# Patient Record
Sex: Female | Born: 1980 | Hispanic: Yes | Marital: Married | Smoking: Former smoker
Health system: Southern US, Community
[De-identification: ages and names within clinical notes are randomized; demographics above are authoritative.]

## PROBLEM LIST (undated history)

## (undated) DIAGNOSIS — C801 Malignant (primary) neoplasm, unspecified: Secondary | ICD-10-CM

## (undated) DIAGNOSIS — E119 Type 2 diabetes mellitus without complications: Secondary | ICD-10-CM

## (undated) DIAGNOSIS — C541 Malignant neoplasm of endometrium: Secondary | ICD-10-CM

## (undated) DIAGNOSIS — K5792 Diverticulitis of intestine, part unspecified, without perforation or abscess without bleeding: Secondary | ICD-10-CM

## (undated) HISTORY — DX: Diverticulitis of intestine, part unspecified, without perforation or abscess without bleeding: K57.92

## (undated) HISTORY — PX: ABDOMINAL HYSTERECTOMY: SHX81

---

## 2017-01-17 ENCOUNTER — Emergency Department: Admission: EM | Admit: 2017-01-17 | Discharge: 2017-01-17 | Discharge status: Absent without leave | Payer: Self-pay

## 2017-01-17 NOTE — ED Notes (Signed)
No answer when called several times from lobby 

## 2018-04-15 ENCOUNTER — Emergency Department
Admission: EM | Admit: 2018-04-15 | Discharge: 2018-04-15 | Disposition: A | Discharge status: Home / Self Care | Payer: Self-pay | Attending: Emergency Medicine | Admitting: Emergency Medicine

## 2018-04-15 ENCOUNTER — Other Ambulatory Visit: Payer: Self-pay

## 2018-04-15 ENCOUNTER — Encounter: Payer: Self-pay | Admitting: Emergency Medicine

## 2018-04-15 ENCOUNTER — Emergency Department: Payer: Self-pay

## 2018-04-15 DIAGNOSIS — Z794 Long term (current) use of insulin: Secondary | ICD-10-CM | POA: Insufficient documentation

## 2018-04-15 DIAGNOSIS — F17200 Nicotine dependence, unspecified, uncomplicated: Secondary | ICD-10-CM | POA: Insufficient documentation

## 2018-04-15 DIAGNOSIS — R1032 Left lower quadrant pain: Secondary | ICD-10-CM | POA: Insufficient documentation

## 2018-04-15 DIAGNOSIS — R197 Diarrhea, unspecified: Secondary | ICD-10-CM | POA: Insufficient documentation

## 2018-04-15 DIAGNOSIS — R112 Nausea with vomiting, unspecified: Secondary | ICD-10-CM | POA: Insufficient documentation

## 2018-04-15 DIAGNOSIS — E119 Type 2 diabetes mellitus without complications: Secondary | ICD-10-CM | POA: Insufficient documentation

## 2018-04-15 HISTORY — DX: Type 2 diabetes mellitus without complications: E11.9

## 2018-04-15 HISTORY — DX: Malignant (primary) neoplasm, unspecified: C80.1

## 2018-04-15 LAB — URINALYSIS, COMPLETE (UACMP) WITH MICROSCOPIC
Bilirubin Urine: NEGATIVE
Glucose, UA: >=500 mg/dL — AB
Hgb urine dipstick: NEGATIVE
Ketones, ur: 5 mg/dL — AB
Nitrite: NEGATIVE
PH: 5 (ref 5.0–8.0)
Protein, ur: NEGATIVE mg/dL
SPECIFIC GRAVITY, URINE: 1.024 (ref 1.005–1.030)

## 2018-04-15 LAB — COMPREHENSIVE METABOLIC PANEL
ALBUMIN: 3.5 g/dL (ref 3.5–5.0)
ALK PHOS: 114 U/L (ref 38–126)
ALT: 54 U/L (ref 14–54)
AST: 49 U/L — AB (ref 15–41)
Anion gap: 10 (ref 5–15)
BILIRUBIN TOTAL: 0.8 mg/dL (ref 0.3–1.2)
BUN: 8 mg/dL (ref 6–20)
CO2: 21 mmol/L — ABNORMAL LOW (ref 22–32)
CREATININE: 0.52 mg/dL (ref 0.44–1.00)
Calcium: 8.4 mg/dL — ABNORMAL LOW (ref 8.9–10.3)
Chloride: 103 mmol/L (ref 101–111)
GFR calc Af Amer: >60 mL/min (ref >60)
GFR calc non Af Amer: >60 mL/min (ref >60)
GLUCOSE: 359 mg/dL — AB (ref 65–99)
POTASSIUM: 3.8 mmol/L (ref 3.5–5.1)
Sodium: 134 mmol/L — ABNORMAL LOW (ref 135–145)
Total Protein: 6.9 g/dL (ref 6.5–8.1)

## 2018-04-15 LAB — PREGNANCY, URINE: Preg Test, Ur: NEGATIVE

## 2018-04-15 LAB — BLOOD GAS, VENOUS
ACID-BASE EXCESS: 1.2 mmol/L (ref 0.0–2.0)
BICARBONATE: 25.1 mmol/L (ref 20.0–28.0)
O2 SAT: 87.2 %
PH VEN: 7.44 — AB (ref 7.250–7.430)
PO2 VEN: 51 mmHg — AB (ref 32.0–45.0)
Patient temperature: 37
pCO2, Ven: 37 mmHg — ABNORMAL LOW (ref 44.0–60.0)

## 2018-04-15 LAB — POCT PREGNANCY, URINE: Preg Test, Ur: NEGATIVE

## 2018-04-15 LAB — CBC
HEMATOCRIT: 40.1 % (ref 35.0–47.0)
HEMOGLOBIN: 14.3 g/dL (ref 12.0–16.0)
MCH: 31.9 pg (ref 26.0–34.0)
MCHC: 35.7 g/dL (ref 32.0–36.0)
MCV: 89.4 fL (ref 80.0–100.0)
Platelets: 234 10*3/uL (ref 150–440)
RBC: 4.48 MIL/uL (ref 3.80–5.20)
RDW: 12.8 % (ref 11.5–14.5)
WBC: 13 10*3/uL — AB (ref 3.6–11.0)

## 2018-04-15 LAB — LIPASE, BLOOD: Lipase: 28 U/L (ref 11–51)

## 2018-04-15 LAB — ETHANOL: Alcohol, Ethyl (B): <10 mg/dL (ref <10)

## 2018-04-15 LAB — BETA-HYDROXYBUTYRIC ACID: Beta-Hydroxybutyric Acid: 0.18 mmol/L (ref 0.05–0.27)

## 2018-04-15 MED ORDER — SODIUM CHLORIDE 0.9 % IV BOLUS
1000.0000 mL | Freq: Once | INTRAVENOUS | Status: AC
  Administered 2018-04-15: 1000 mL via INTRAVENOUS

## 2018-04-15 MED ORDER — IOPAMIDOL (ISOVUE-300) INJECTION 61%
100.0000 mL | Freq: Once | INTRAVENOUS | Status: AC | PRN
  Administered 2018-04-15: 100 mL via INTRAVENOUS

## 2018-04-15 MED ORDER — FENTANYL CITRATE (PF) 100 MCG/2ML IJ SOLN
50.0000 ug | Freq: Once | INTRAMUSCULAR | Status: AC
  Administered 2018-04-15: 50 ug via INTRAVENOUS
  Filled 2018-04-15: qty 2

## 2018-04-15 MED ORDER — ONDANSETRON HCL 4 MG PO TABS: 4.0000 mg | ORAL_TABLET | Freq: Three times a day (TID) | ORAL | 0 refills | Status: DC | PRN

## 2018-04-15 MED ORDER — ONDANSETRON HCL 4 MG/2ML IJ SOLN
4.0000 mg | Freq: Once | INTRAMUSCULAR | Status: AC
  Administered 2018-04-15: 4 mg via INTRAVENOUS
  Filled 2018-04-15: qty 2

## 2018-04-15 NOTE — ED Triage Notes (Signed)
Pt to ED via POV c/o abdominal pain, N/V/D and high blood sugar. Pt states that her blood sugar was 338 at home. Pt states that other symptoms started this morning around 0400. Pt is in NAD at this time.

## 2018-04-15 NOTE — ED Notes (Signed)
Pt states central abd pain with L kidney pain that began this AM. Also c/o N&V&D. States type 2 DM but uses insulin. Denies blood in vomit or stool. Does NOT have appendix but still has gallbladder. States ate pizza last night and states husband is feeling similar but pt states she has been feeling this way x few days.

## 2018-04-15 NOTE — ED Provider Notes (Addendum)
[facility] Emergency Department Provider Note  ____________________________________________   I have reviewed the triage vital signs and the nursing notes. Where available I have reviewed prior notes and, if possible and indicated, outside hospital notes.    HISTORY  Chief Complaint Abdominal Pain; Emesis; and Hyperglycemia    HPI Erica Bush is a 37 y.o. female with a history of poorly controlled diabetes mellitus, on insulin, baseline blood sugars are in the 300s, history of chronic lower abdominal pain history of appendectomy in 1998, with peripheral resulting in a temporary ileostomy to what sounds like, since that time, 20 years ago, she has had chronic lower abdominal discomfort in the left side, she has history of diverticulitis.  She states is been about a year since her last flare.  She was living in [place] for the last couple years.  She states that she began having left-sided abdominal pain which is consistent with her chronic diverticular pathology, as well as vomiting x10 and diarrhea nonbloody non-melanotic.  She states that she has had no fever or chills.  She is requesting pain medication.  Patient while she is in [place] went to [place] [facility] but she cannot recall the name of that hospital to which she went.  She states this is consistent with her prior chronic recurrent abdominal pain sometimes associate with diverticulitis sometimes just pain  Pain is a crampy discomfort nothing makes better nothing makes it worse, been there since last night gradual onset, no radiation, no fever no chills no antecedent intervention.  Requesting narcotic pain medication, morphine   Past Medical History:  Diagnosis Date  . Diabetes mellitus without complication ([place])     There are no active problems to display for this patient.     Prior to Admission medications   Not on File    Allergies Ciprofloxacin  No family history on  file.  Social History Social History   Tobacco Use  . Smoking status: Current Every Day Smoker  . Smokeless tobacco: Never Used  Substance Use Topics  . Alcohol use: Not Currently  . Drug use: Not Currently    Review of Systems Constitutional: No fever/chills Eyes: No visual changes. ENT: No sore throat. No stiff neck no neck pain Cardiovascular: Denies chest pain. Respiratory: Denies shortness of breath. Gastrointestinal:   See HPI Genitourinary: Negative for dysuria. Musculoskeletal: Negative lower extremity swelling Skin: Negative for rash. Neurological: Negative for severe headaches, focal weakness or numbness.   ____________________________________________   PHYSICAL EXAM:  VITAL SIGNS: ED Triage Vitals  Enc Vitals Group     BP 04/15/18 0905 120/71     Pulse Rate 04/15/18 0905 (!) 117     Resp 04/15/18 0905 16     Temp 04/15/18 0905 98.1 F (36.7 C)     Temp Source 04/15/18 0905 Oral     SpO2 04/15/18 0905 96 %     Weight 04/15/18 0904 180 lb (81.6 kg)     Height 04/15/18 0904 5\' 1"  (1.549 m)     Head Circumference --      Peak Flow --      Pain Score 04/15/18 0904 9     Pain Loc --      Pain Edu? --      Excl. in [place]? --     Constitutional: Alert and oriented. Well appearing and in no acute distress. Eyes: Conjunctivae are normal Head: Atraumatic HEENT: No congestion/rhinnorhea. Mucous membranes are moist.  Oropharynx non-erythematous Neck:   Nontender  with no meningismus, no masses, no stridor Cardiovascular: Normal rate, regular rhythm. Grossly normal heart sounds.  Good peripheral circulation. Respiratory: Normal respiratory effort.  No retractions. Lungs CTAB. Abdominal: Soft and tender to palpation left lower quadrant, voluntary guarding no rebound, distractible exam recently noted.. No distention.  Back:  There is no focal tenderness or step off.  there is no midline tenderness there are no lesions noted. there is no CVA  tenderness Musculoskeletal: No lower extremity tenderness, no upper extremity tenderness. No joint effusions, no DVT signs strong distal pulses no edema Neurologic:  Normal speech and language. No gross focal neurologic deficits are appreciated.  Skin:  Skin is warm, dry and intact. No rash noted. Psychiatric: Mood and affect are anxious. Speech and behavior are normal.  ____________________________________________   LABS (all labs ordered are listed, but only abnormal results are displayed)  Labs Reviewed  BLOOD GAS, VENOUS - Abnormal; Notable for the following components:      Result Value   pH, Ven 7.44 (*)    pCO2, Ven 37 (*)    pO2, Ven 51.0 (*)    All other components within normal limits  CBC - Abnormal; Notable for the following components:   WBC 13.0 (*)    All other components within normal limits  COMPREHENSIVE METABOLIC PANEL - Abnormal; Notable for the following components:   Sodium 134 (*)    CO2 21 (*)    Glucose, Bld 359 (*)    Calcium 8.4 (*)    AST 49 (*)    All other components within normal limits  ETHANOL  LIPASE, BLOOD  URINALYSIS, COMPLETE (UACMP) WITH MICROSCOPIC  PREGNANCY, URINE  BETA-HYDROXYBUTYRIC ACID  POCT PREGNANCY, URINE  POC URINE PREG, ED    Pertinent labs  results that were available during my care of the patient were reviewed by me and considered in my medical decision making (see chart for details). ____________________________________________  EKG  I personally interpreted any EKGs ordered by me or triage  ____________________________________________  RADIOLOGY  Pertinent labs & imaging results that were available during my care of the patient were reviewed by me and considered in my medical decision making (see chart for details). If possible, patient and/or family made aware of any abnormal findings.  No results found. ____________________________________________    PROCEDURES  Procedure(s) performed:  None  Procedures  Critical Care performed: None  ____________________________________________   INITIAL IMPRESSION / ASSESSMENT AND PLAN / ED COURSE  Pertinent labs & imaging results that were available during my care of the patient were reviewed by me and considered in my medical decision making (see chart for details).  Patient with chronic recurrent abdominal pain has had extensive CT scans sometimes has diverticulitis sometimes is not most of the time there is not, I can see 11 to her prior CT scans in this patient however, given history prior abdominal surgeries patient's complaint of significant discomfort, focalizing pain, history of diverticulitis, even though I have low suspicion for perforation or abscess I think in this case unfortunately we will have to obtain imaging as multiple ER doctors unfortunately have been to do in the past.  Meantime we will give her IV fluids treat her pain.  Nothing to suggest DKA, patient was mildly tachycardic but she is also quite anxious, and has been volume depleted.  We will give her IV fluid.  VBG shows no evidence of acidemia fortunately and again her baseline blood sugars are in the 300s.  We will treat her pain  and give her nausea medication give her fluid and find out if there is something more significant in her chronic recurrent abdominal pain today.  ----------------------------------------- 12:14 PM on 04/15/2018 -----------------------------------------  Serial abdominal exams are benign heart rate is in the 90s when I am in the room, patient very angry that I am not giving her more narcotic pain medication for this chronic abdominal pain.  I tried to explain to her, that with a negative CT scan reassuring blood work reassuring vitals etc. there is no indication for acute narcotic pain medication.  This is the patient's 12th CT scan for what seems to be the exact same complaint over the last several years that I can find and likely there are  others in [place] I cannot.  I did expand her I am not comfortable giving her narcotic pain medications and she became very upset.  I did go through point by point all of her findings which are certainly reassuring and her belly is not surgical.  Patient is tolerating p.o. here.  After our discussion of narcotic pain medication her heart rate did go up, I do not think this is a pathologic this is more of a reactive heart rate.  In any event I will send her home with anti-medics, and we will have her follow closely as an outpatient.  Considering the patient's symptoms, medical history, and physical examination today, I have low suspicion for cholecystitis or biliary pathology, pancreatitis, perforation or bowel obstruction, hernia, intra-abdominal abscess, AAA or dissection, volvulus or intussusception, mesenteric ischemia, ischemic gut, pyelonephritis or appendicitis.    ____________________________________________   FINAL CLINICAL IMPRESSION(S) / ED DIAGNOSES  Final diagnoses:  None      This chart was dictated using voice recognition software.  Despite best efforts to proofread,  errors can occur which can change meaning.      Schuyler Amor, MD 04/15/18 1028    Schuyler Amor, MD 04/15/18 1221    Schuyler Amor, MD 04/15/18 [phone]

## 2018-04-15 NOTE — ED Notes (Signed)
Recollect of blood tubes sent to lab at this time.

## 2018-10-24 ENCOUNTER — Emergency Department: Payer: Self-pay

## 2018-10-24 ENCOUNTER — Other Ambulatory Visit: Payer: Self-pay

## 2018-10-24 ENCOUNTER — Encounter: Payer: Self-pay | Admitting: Emergency Medicine

## 2018-10-24 ENCOUNTER — Emergency Department
Admission: EM | Admit: 2018-10-24 | Discharge: 2018-10-24 | Disposition: A | Discharge status: Home / Self Care | Payer: Self-pay | Attending: Emergency Medicine | Admitting: Emergency Medicine

## 2018-10-24 DIAGNOSIS — E119 Type 2 diabetes mellitus without complications: Secondary | ICD-10-CM | POA: Insufficient documentation

## 2018-10-24 DIAGNOSIS — K529 Noninfective gastroenteritis and colitis, unspecified: Secondary | ICD-10-CM | POA: Insufficient documentation

## 2018-10-24 DIAGNOSIS — F17210 Nicotine dependence, cigarettes, uncomplicated: Secondary | ICD-10-CM | POA: Insufficient documentation

## 2018-10-24 LAB — URINALYSIS, COMPLETE (UACMP) WITH MICROSCOPIC
Bilirubin Urine: NEGATIVE
Glucose, UA: 50 mg/dL — AB
Hgb urine dipstick: NEGATIVE
Ketones, ur: 5 mg/dL — AB
Leukocytes, UA: NEGATIVE
Nitrite: NEGATIVE
Protein, ur: NEGATIVE mg/dL
Specific Gravity, Urine: 1.025 (ref 1.005–1.030)
pH: 5 (ref 5.0–8.0)

## 2018-10-24 LAB — CBC
HCT: 42.7 % (ref 36.0–46.0)
Hemoglobin: 15.2 g/dL — ABNORMAL HIGH (ref 12.0–15.0)
MCH: 31.5 pg (ref 26.0–34.0)
MCHC: 35.6 g/dL (ref 30.0–36.0)
MCV: 88.6 fL (ref 80.0–100.0)
NRBC: 0 % (ref 0.0–0.2)
PLATELETS: 306 10*3/uL (ref 150–400)
RBC: 4.82 MIL/uL (ref 3.87–5.11)
RDW: 11.6 % (ref 11.5–15.5)
WBC: 13.4 10*3/uL — ABNORMAL HIGH (ref 4.0–10.5)

## 2018-10-24 LAB — COMPREHENSIVE METABOLIC PANEL
ALT: 42 U/L (ref 0–44)
AST: 31 U/L (ref 15–41)
Albumin: 3.8 g/dL (ref 3.5–5.0)
Alkaline Phosphatase: 127 U/L — ABNORMAL HIGH (ref 38–126)
Anion gap: 12 (ref 5–15)
BUN: 14 mg/dL (ref 6–20)
CHLORIDE: 100 mmol/L (ref 98–111)
CO2: 22 mmol/L (ref 22–32)
Calcium: 8.8 mg/dL — ABNORMAL LOW (ref 8.9–10.3)
Creatinine, Ser: 0.68 mg/dL (ref 0.44–1.00)
Glucose, Bld: 288 mg/dL — ABNORMAL HIGH (ref 70–99)
POTASSIUM: 3.6 mmol/L (ref 3.5–5.1)
SODIUM: 134 mmol/L — AB (ref 135–145)
Total Bilirubin: 1 mg/dL (ref 0.3–1.2)
Total Protein: 7.6 g/dL (ref 6.5–8.1)

## 2018-10-24 LAB — LIPASE, BLOOD: LIPASE: 32 U/L (ref 11–51)

## 2018-10-24 MED ORDER — MORPHINE SULFATE (PF) 4 MG/ML IV SOLN
4.0000 mg | Freq: Once | INTRAVENOUS | Status: AC
  Administered 2018-10-24: 4 mg via INTRAVENOUS
  Filled 2018-10-24: qty 1

## 2018-10-24 MED ORDER — ONDANSETRON 4 MG PO TBDP: 4.0000 mg | ORAL_TABLET | Freq: Three times a day (TID) | ORAL | 0 refills | Status: DC | PRN

## 2018-10-24 MED ORDER — ONDANSETRON HCL 4 MG/2ML IJ SOLN
4.0000 mg | Freq: Once | INTRAMUSCULAR | Status: AC
  Administered 2018-10-24: 4 mg via INTRAVENOUS
  Filled 2018-10-24: qty 2

## 2018-10-24 NOTE — ED Notes (Signed)
Pt reports that 2 days ago she started with left flank pain that progressed to bilat flank pain - denies any urinary symptoms - reports pain 10/10 - reports that this am she started with vomiting

## 2018-10-24 NOTE — ED Provider Notes (Signed)
[facility] Emergency Department Provider Note   ____________________________________________    I have reviewed the triage vital signs and the nursing notes.   HISTORY  Chief Complaint Emesis and Flank Pain     HPI Erica Bush is a 38 y.o. female who presents with complaints of nausea, vomiting and bilateral flank pain, left greater than right.  She reports she had similar symptoms several years ago related to a kidney stone.  She reports pain has been on and off over past 2 days but it got much worse last night.  Has not take anything for this.  Denies fevers or chills.  She is diabetic.  Nothing seems to make her pain better   Past Medical History:  Diagnosis Date  . Cancer ([place])   . Diabetes mellitus without complication ([place])     There are no active problems to display for this patient.   Past Surgical History:  Procedure Laterality Date  . ABDOMINAL HYSTERECTOMY      Prior to Admission medications   Medication Sig Start Date End Date Taking? Authorizing Provider  albuterol (ACCUNEB) 0.63 MG/3ML nebulizer solution Inhale 0.63 mg into the lungs every 6 (six) hours as needed.   Yes [provider]  insulin NPH-regular Human (70-30) 100 UNIT/ML injection Inject 30-40 Units into the skin daily with supper.   Yes [provider]  ondansetron (ZOFRAN ODT) 4 MG disintegrating tablet Take 1 tablet (4 mg total) by mouth every 8 (eight) hours as needed for nausea or vomiting. 10/24/18   Lavonia Drafts, MD     Allergies Ciprofloxacin and Dexamethasone sodium phosphate  History reviewed. No pertinent family history.  Social History Social History   Tobacco Use  . Smoking status: Current Every Day Smoker  . Smokeless tobacco: Never Used  Substance Use Topics  . Alcohol use: Not Currently  . Drug use: Not Currently    Review of Systems  Constitutional: No fever/chills Eyes: No visual changes.  ENT: No sore  throat. Cardiovascular: Denies chest pain. Respiratory: Denies shortness of breath. Gastrointestinal: As above Genitourinary: Negative for dysuria. Musculoskeletal: Negative for back pain. Skin: Negative for rash. Neurological: Negative for headaches    ____________________________________________   PHYSICAL EXAM:  VITAL SIGNS: ED Triage Vitals  Enc Vitals Group     BP 10/24/18 0728 122/85     Pulse Rate 10/24/18 0728 (!) 106     Resp 10/24/18 0728 (!) 24     Temp 10/24/18 0728 98.8 F (37.1 C)     Temp Source 10/24/18 0728 Oral     SpO2 10/24/18 0728 96 %     Weight --      Height --      Head Circumference --      Peak Flow --      Pain Score 10/24/18 0724 10     Pain Loc --      Pain Edu? --      Excl. in [place]? --     Constitutional: Alert and oriented.  Eyes: Conjunctivae are normal.   Nose: No congestion/rhinnorhea. Mouth/Throat: Mucous membranes are moist.    Cardiovascular: Normal rate, regular rhythm. Grossly normal heart sounds.  Good peripheral circulation. Respiratory: Normal respiratory effort.  No retractions. Lungs CTAB. Gastrointestinal: Soft and nontender. No distention.  Left CVA tenderness Musculoskeletal: Warm and well perfused Neurologic:  Normal speech and language. No gross focal neurologic deficits are appreciated.  Skin:  Skin is warm, dry and intact. No rash  noted. Psychiatric: Mood and affect are normal. Speech and behavior are normal.  ____________________________________________   LABS (all labs ordered are listed, but only abnormal results are displayed)  Labs Reviewed  CBC - Abnormal; Notable for the following components:      Result Value   WBC 13.4 (*)    Hemoglobin 15.2 (*)    All other components within normal limits  COMPREHENSIVE METABOLIC PANEL - Abnormal; Notable for the following components:   Sodium 134 (*)    Glucose, Bld 288 (*)    Calcium 8.8 (*)    Alkaline Phosphatase 127 (*)    All other components within  normal limits  URINALYSIS, COMPLETE (UACMP) WITH MICROSCOPIC - Abnormal; Notable for the following components:   Color, Urine YELLOW (*)    APPearance CLOUDY (*)    Glucose, UA 50 (*)    Ketones, ur 5 (*)    Bacteria, UA RARE (*)    All other components within normal limits  LIPASE, BLOOD   ____________________________________________  EKG  None ____________________________________________  RADIOLOGY  CT renal stone study ____________________________________________   PROCEDURES  Procedure(s) performed: No  Procedures   Critical Care performed: No ____________________________________________   INITIAL IMPRESSION / ASSESSMENT AND PLAN / ED COURSE  Pertinent labs & imaging results that were available during my care of the patient were reviewed by me and considered in my medical decision making (see chart for details).  Patient presents with bilateral flank pain, differential includes [place], less likely ureterolithiasis although she does report left greater than right pain.  We will give IV morphine, IV Zofran, obtain labs, urinalysis, CT renal stone study and reevaluate.   ----------------------------------------- 9:47 AM on 10/24/2018 -----------------------------------------  CT negative for kidney stone, no acute abnormalities.  Reevaluated patient, she is feeling significantly better, still having some abdominal cramping.  She does admit to diarrhea as well as her nausea vomiting, possible viral gastroenteritis which is common in the area at this time   Appropriate for discharge at this point with outpatient follow-up    ____________________________________________   FINAL CLINICAL IMPRESSION(S) / ED DIAGNOSES  Final diagnoses:  Gastroenteritis        Note:  This document was prepared using Dragon voice recognition software and may include unintentional dictation errors.    Lavonia Drafts, MD 10/24/18 1204

## 2018-10-24 NOTE — ED Triage Notes (Signed)
Here for pain across bilateral flank area.  Started vomiting this morning. Is diabetic. Has not checked sugar. Took insulin last night. Has not checked blood sugar.  Vomiting at check in desk.  Appears in pain.

## 2018-10-24 NOTE — ED Notes (Signed)
POC Urine Preg NEGATIVE - Dr Corky Downs notified

## 2018-11-21 ENCOUNTER — Other Ambulatory Visit: Payer: Self-pay

## 2018-11-21 ENCOUNTER — Emergency Department: Payer: Self-pay

## 2018-11-21 ENCOUNTER — Inpatient Hospital Stay
Admission: EM | Admit: 2018-11-21 | Discharge: 2018-11-24 | DRG: 392 | Disposition: A | Discharge status: Home / Self Care | Payer: Self-pay | Attending: Internal Medicine | Admitting: Internal Medicine

## 2018-11-21 ENCOUNTER — Encounter: Payer: Self-pay | Admitting: Radiology

## 2018-11-21 DIAGNOSIS — Z90710 Acquired absence of both cervix and uterus: Secondary | ICD-10-CM

## 2018-11-21 DIAGNOSIS — Z794 Long term (current) use of insulin: Secondary | ICD-10-CM

## 2018-11-21 DIAGNOSIS — Z881 Allergy status to other antibiotic agents status: Secondary | ICD-10-CM

## 2018-11-21 DIAGNOSIS — E119 Type 2 diabetes mellitus without complications: Secondary | ICD-10-CM | POA: Diagnosis present

## 2018-11-21 DIAGNOSIS — F17200 Nicotine dependence, unspecified, uncomplicated: Secondary | ICD-10-CM | POA: Diagnosis present

## 2018-11-21 DIAGNOSIS — Z8542 Personal history of malignant neoplasm of other parts of uterus: Secondary | ICD-10-CM

## 2018-11-21 DIAGNOSIS — Z9049 Acquired absence of other specified parts of digestive tract: Secondary | ICD-10-CM

## 2018-11-21 DIAGNOSIS — K5792 Diverticulitis of intestine, part unspecified, without perforation or abscess without bleeding: Secondary | ICD-10-CM | POA: Diagnosis present

## 2018-11-21 DIAGNOSIS — K5732 Diverticulitis of large intestine without perforation or abscess without bleeding: Principal | ICD-10-CM | POA: Diagnosis present

## 2018-11-21 DIAGNOSIS — Z888 Allergy status to other drugs, medicaments and biological substances status: Secondary | ICD-10-CM

## 2018-11-21 LAB — CBC
HEMATOCRIT: 40.7 % (ref 36.0–46.0)
HEMOGLOBIN: 14.2 g/dL (ref 12.0–15.0)
MCH: 30.8 pg (ref 26.0–34.0)
MCHC: 34.9 g/dL (ref 30.0–36.0)
MCV: 88.3 fL (ref 80.0–100.0)
Platelets: 310 10*3/uL (ref 150–400)
RBC: 4.61 MIL/uL (ref 3.87–5.11)
RDW: 11.7 % (ref 11.5–15.5)
WBC: 11.8 10*3/uL — ABNORMAL HIGH (ref 4.0–10.5)
nRBC: 0 % (ref 0.0–0.2)

## 2018-11-21 LAB — COMPREHENSIVE METABOLIC PANEL
ALT: 43 U/L (ref 0–44)
AST: 30 U/L (ref 15–41)
Albumin: 4.1 g/dL (ref 3.5–5.0)
Alkaline Phosphatase: 97 U/L (ref 38–126)
Anion gap: 8 (ref 5–15)
BILIRUBIN TOTAL: 0.8 mg/dL (ref 0.3–1.2)
BUN: 10 mg/dL (ref 6–20)
CALCIUM: 9 mg/dL (ref 8.9–10.3)
CO2: 24 mmol/L (ref 22–32)
Chloride: 104 mmol/L (ref 98–111)
Creatinine, Ser: 0.54 mg/dL (ref 0.44–1.00)
GFR calc Af Amer: >60 mL/min (ref >60)
Glucose, Bld: 274 mg/dL — ABNORMAL HIGH (ref 70–99)
Potassium: 3.6 mmol/L (ref 3.5–5.1)
Sodium: 136 mmol/L (ref 135–145)
Total Protein: 7.6 g/dL (ref 6.5–8.1)

## 2018-11-21 LAB — URINALYSIS, COMPLETE (UACMP) WITH MICROSCOPIC
Bilirubin Urine: NEGATIVE
Glucose, UA: 50 mg/dL — AB
Hgb urine dipstick: NEGATIVE
Ketones, ur: 5 mg/dL — AB
Leukocytes, UA: NEGATIVE
Nitrite: NEGATIVE
Protein, ur: NEGATIVE mg/dL
Specific Gravity, Urine: 1.029 (ref 1.005–1.030)
pH: 5 (ref 5.0–8.0)

## 2018-11-21 LAB — LIPASE, BLOOD: Lipase: 29 U/L (ref 11–51)

## 2018-11-21 MED ORDER — ONDANSETRON HCL 4 MG/2ML IJ SOLN
4.0000 mg | Freq: Once | INTRAMUSCULAR | Status: AC
  Administered 2018-11-21: 4 mg via INTRAVENOUS
  Filled 2018-11-21: qty 2

## 2018-11-21 MED ORDER — INSULIN ASPART 100 UNIT/ML ~~LOC~~ SOLN
0.0000 [IU] | Freq: Three times a day (TID) | SUBCUTANEOUS | Status: DC
  Administered 2018-11-22 (×2): 3 [IU] via SUBCUTANEOUS
  Filled 2018-11-21 (×2): qty 1

## 2018-11-21 MED ORDER — HYDROMORPHONE HCL 1 MG/ML IJ SOLN
1.0000 mg | Freq: Once | INTRAMUSCULAR | Status: AC
  Administered 2018-11-21: 1 mg via INTRAVENOUS
  Filled 2018-11-21: qty 1

## 2018-11-21 MED ORDER — HYDROMORPHONE HCL 1 MG/ML IJ SOLN: 1.0000 mg | Freq: Once | INTRAMUSCULAR | Status: DC

## 2018-11-21 MED ORDER — SODIUM CHLORIDE 0.9 % IV SOLN
1.0000 g | Freq: Once | INTRAVENOUS | Status: AC
  Administered 2018-11-21: 1 g via INTRAVENOUS
  Filled 2018-11-21: qty 10

## 2018-11-21 MED ORDER — SODIUM CHLORIDE 0.9 % IV BOLUS
1000.0000 mL | Freq: Once | INTRAVENOUS | Status: AC
  Administered 2018-11-21: 1000 mL via INTRAVENOUS

## 2018-11-21 MED ORDER — IOPAMIDOL (ISOVUE-300) INJECTION 61%
100.0000 mL | Freq: Once | INTRAVENOUS | Status: AC | PRN
  Administered 2018-11-21: 100 mL via INTRAVENOUS
  Filled 2018-11-21: qty 100

## 2018-11-21 MED ORDER — SODIUM CHLORIDE 0.9 % IV SOLN
3.0000 g | Freq: Three times a day (TID) | INTRAVENOUS | Status: DC
  Administered 2018-11-22 – 2018-11-24 (×8): 3 g via INTRAVENOUS
  Filled 2018-11-21 (×10): qty 3

## 2018-11-21 MED ORDER — METRONIDAZOLE IN NACL 5-0.79 MG/ML-% IV SOLN
500.0000 mg | Freq: Once | INTRAVENOUS | Status: AC
  Administered 2018-11-21: 500 mg via INTRAVENOUS
  Filled 2018-11-21 (×2): qty 100

## 2018-11-21 NOTE — H&P (Signed)
[facility] NAME: Erica Bush    MR#:  [id number]  DATE OF BIRTH:  1981/07/01  DATE OF ADMISSION:  11/21/2018  PRIMARY CARE PHYSICIAN: Patient, No Pcp Per   REQUESTING/REFERRING PHYSICIAN: dr Mariea Clonts  CHIEF COMPLAINT:   Left lower quadrant abdominal pain for 1 to 2 days HISTORY OF PRESENT ILLNESS:   Erica Bush is a 37 y.o. female status post colon resection with delayed re-anastomosis in 1999, recurrent diverticulitis, renal colic, ruptured appendicitis status post appendectomy, endometrial cancer status post hysterectomy, DM 2, presenting for left lower quadrant pain, nausea without vomiting, for the last several days typical of her prior diverticulitis.  The patient has had some loose stool.  She denies any fevers or chills.  Patient received IV Rocephin and Flagyl in the emergency room. No Family PAST MEDICAL HISTORY:   Past Medical History:  Diagnosis Date  . Cancer ([place])   . Diabetes mellitus without complication ([place])     PAST SURGICAL HISTOIRY:   Past Surgical History:  Procedure Laterality Date  . ABDOMINAL HYSTERECTOMY      SOCIAL HISTORY:   Social History   Tobacco Use  . Smoking status: Current Every Day Smoker  . Smokeless tobacco: Never Used  Substance Use Topics  . Alcohol use: Not Currently    FAMILY HISTORY:  No family history on file.  DRUG ALLERGIES:   Allergies  Allergen Reactions  . Ciprofloxacin Shortness Of Breath and Nausea And Vomiting    Shortness of breath  . Dexamethasone Sodium Phosphate Anaphylaxis, Rash and Swelling    REVIEW OF SYSTEMS:  Review of Systems  Constitutional: Negative for chills, fever and weight loss.  HENT: Negative for ear discharge, ear pain and nosebleeds.   Eyes: Negative for blurred vision, pain and discharge.  Respiratory: Negative for sputum production, shortness of breath, wheezing and stridor.   Cardiovascular: Negative for  chest pain, palpitations, orthopnea and PND.  Gastrointestinal: Positive for abdominal pain and nausea. Negative for diarrhea and vomiting.  Genitourinary: Negative for frequency and urgency.  Musculoskeletal: Negative for back pain and joint pain.  Neurological: Negative for sensory change, speech change, focal weakness and weakness.  Psychiatric/Behavioral: Negative for depression and hallucinations. The patient is not nervous/anxious.      MEDICATIONS AT HOME:   Prior to Admission medications   Medication Sig Start Date End Date Taking? Authorizing Provider  insulin NPH-regular Human (70-30) 100 UNIT/ML injection Inject 30-40 Units into the skin daily with supper.   Yes [provider]  ondansetron (ZOFRAN ODT) 4 MG disintegrating tablet Take 1 tablet (4 mg total) by mouth every 8 (eight) hours as needed for nausea or vomiting. Patient not taking: Reported on 11/21/2018 10/24/18   Lavonia Drafts, MD      VITAL SIGNS:  Blood pressure 110/63, pulse 92, temperature 98 F (36.7 C), temperature source Oral, resp. rate 20, height 5\' 2"  (1.575 m), weight 81.6 kg, SpO2 99 %.  PHYSICAL EXAMINATION:  GENERAL:  38 y.o.-year-old patient lying in the bed with no acute distress.  EYES: Pupils equal, round, reactive to light and accommodation. No scleral icterus. Extraocular muscles intact.  HEENT: Head atraumatic, normocephalic. Oropharynx and nasopharynx clear.  NECK:  Supple, no jugular venous distention. No thyroid enlargement, no tenderness.  LUNGS: Normal breath sounds bilaterally, no wheezing, rales,rhonchi or crepitation. No use of accessory muscles of respiration.  CARDIOVASCULAR: S1, S2 normal. No murmurs, rubs, or gallops.  ABDOMEN: Soft, nontender, nondistended.  Bowel sounds present. No organomegaly or mass.  EXTREMITIES: No pedal edema, cyanosis, or clubbing.  NEUROLOGIC: Cranial nerves II through XII are intact. Muscle strength 5/5 in all extremities. Sensation intact. Gait not  checked.  PSYCHIATRIC: The patient is alert and oriented x 3.  SKIN: No obvious rash, lesion, or ulcer.   LABORATORY PANEL:   CBC Recent Labs  Lab 11/21/18 2001  WBC 11.8*  HGB 14.2  HCT 40.7  PLT 310   ------------------------------------------------------------------------------------------------------------------  Chemistries  Recent Labs  Lab 11/21/18 2001  NA 136  K 3.6  CL 104  CO2 24  GLUCOSE 274*  BUN 10  CREATININE 0.54  CALCIUM 9.0  AST 30  ALT 43  ALKPHOS 97  BILITOT 0.8   ------------------------------------------------------------------------------------------------------------------  Cardiac Enzymes No results for input(s): TROPONINI in the last 168 hours. ------------------------------------------------------------------------------------------------------------------  RADIOLOGY:  Ct Abdomen Pelvis W Contrast  Result Date: 11/21/2018 CLINICAL DATA:  Lower abdominal pain EXAM: CT ABDOMEN AND PELVIS WITH CONTRAST TECHNIQUE: Multidetector CT imaging of the abdomen and pelvis was performed using the standard protocol following bolus administration of intravenous contrast. CONTRAST:  153mL ISOVUE-300 IOPAMIDOL (ISOVUE-300) INJECTION 61% COMPARISON:  CT 10/24/2018 FINDINGS: Lower chest: Lung bases demonstrate no acute consolidation or effusion. Normal heart size. Hepatobiliary: Hepatic steatosis. No calcified gallstone or biliary dilatation Pancreas: Unremarkable. No pancreatic ductal dilatation or surrounding inflammatory changes. Spleen: Enlarged, measuring up to 15 cm. Adrenals/Urinary Tract: Adrenal glands are unremarkable. Kidneys are normal, without renal calculi, focal lesion, or hydronephrosis. Bladder is unremarkable. Stomach/Bowel: Stomach within normal limits. No dilated small bowel. Sigmoid colon diverticular disease. Mild edema within the fat adjacent to the sigmoid colon, consistent with mild diverticulitis. Vascular/Lymphatic: Nonaneurysmal aorta.  Mild aortic atherosclerosis. No significantly enlarged lymph nodes Reproductive: Status post hysterectomy. No adnexal masses. Other: Negative for free air or free fluid. Small fat containing periumbilical hernia. Small fat containing supraumbilical ventral hernia. Musculoskeletal: No acute or suspicious abnormality IMPRESSION: 1. Mild wall thickening and inflammatory change at the sigmoid colon with diverticula present, consistent with an acute diverticulitis. No perforation. No abscess. 2. Hepatic steatosis 3. Splenomegaly Electronically Signed   By: Donavan Foil M.D.   On: 11/21/2018 22:01    EKG:    IMPRESSION AND PLAN:   [name] is a 38 y.o. female status post colon resection with delayed re-anastomosis in 1999, recurrent diverticulitis, renal colic, ruptured appendicitis status post appendectomy, endometrial cancer status post hysterectomy, DM 2, presenting for left lower quadrant pain, nausea without vomiting, for the last several days typical of her prior diverticulitis.   1. Sigmoid: diverticulitis -admit to medical floor -IV unison -IV and oral pain meds -clear liquid diet -G.I. and or surgical consultation if needed  2. Type II diabetes -insulin 7030-- I will give her half the dose of 20 units tonight -sliding scale insulin  3. DVT prophylaxis subcu Lovenox  4. Nausea PRN Zofran   All the records are reviewed and case discussed with ED provider.   CODE STATUS: full  TOTAL TIME TAKING CARE OF THIS PATIENT: 50 minutes.    Fritzi Mandes M.D on 11/21/2018 at 11:23 PM  Between 7am to 6pm - Pager - [phone]  After 6pm go to [email or web address] - password [email or web address] [facility]  Office  [phone]  CC: Primary care physician; Patient, No Pcp Per

## 2018-11-21 NOTE — ED Provider Notes (Addendum)
[facility] Emergency Department Provider Note  ____________________________________________  Time seen: Approximately 9:16 PM  I have reviewed the triage vital signs and the nursing notes.   HISTORY  Chief Complaint Abdominal Pain    HPI Erica Bush is a 38 y.o. female status post colon resection with delayed re-anastomosis in 1999, recurrent diverticulitis, renal colic, ruptured appendicitis status post appendectomy, endometrial cancer status post hysterectomy, DM 2, presenting for left lower quadrant pain, nausea without vomiting, for the last several days typical of her prior diverticulitis.  The patient has had some loose stool.  She denies any fevers or chills.  She denies any urinary symptoms.  She has not tried anything for her pain.  Past Medical History:  Diagnosis Date  . Cancer ([place])   . Diabetes mellitus without complication ([place])     There are no active problems to display for this patient.   Past Surgical History:  Procedure Laterality Date  . ABDOMINAL HYSTERECTOMY      Current Outpatient Rx  . Order #: [id number] Class: Historical Med  . Order #: [id number] Class: Historical Med  . Order #: [id number] Class: Normal    Allergies Ciprofloxacin and Dexamethasone sodium phosphate  No family history on file.  Social History Social History   Tobacco Use  . Smoking status: Current Every Day Smoker  . Smokeless tobacco: Never Used  Substance Use Topics  . Alcohol use: Not Currently  . Drug use: Not Currently    Review of Systems Constitutional: No fever/chills.  No lightheadedness or syncope. Eyes: No visual changes. ENT: No sore throat. No congestion or rhinorrhea. Cardiovascular: Denies chest pain. Denies palpitations. Respiratory: Denies shortness of breath.  No cough. Gastrointestinal: Positive left lower quadrant abdominal pain.  Positive nausea, no vomiting.  No diarrhea.  No constipation. Genitourinary:  Negative for dysuria.  No hematuria.  No urinary frequency.  No change in vaginal discharge. Musculoskeletal: Negative for back pain. Skin: Negative for rash. Neurological: Negative for headaches. No focal numbness, tingling or weakness.     ____________________________________________   PHYSICAL EXAM:  VITAL SIGNS: ED Triage Vitals  Enc Vitals Group     BP 11/21/18 1944 104/72     Pulse Rate 11/21/18 1944 (!) 108     Resp 11/21/18 1944 (!) 22     Temp 11/21/18 1944 98.1 F (36.7 C)     Temp Source 11/21/18 1944 Oral     SpO2 11/21/18 1944 97 %     Weight 11/21/18 1945 180 lb (81.6 kg)     Height 11/21/18 1945 5\' 2"  (1.575 m)     Head Circumference --      Peak Flow --      Pain Score 11/21/18 1959 9     Pain Loc --      Pain Edu? --      Excl. in [place]? --     Constitutional: Alert and oriented. Answers questions appropriately.  Uncomfortable appearing. Eyes: Conjunctivae are normal.  EOMI. No scleral icterus. Head: Atraumatic. Nose: No congestion/rhinnorhea. Mouth/Throat: Mucous membranes are moist.  Neck: No stridor.  Supple.   Cardiovascular: Normal rate, regular rhythm. No murmurs, rubs or gallops.  Respiratory: Normal respiratory effort.  No accessory muscle use or retractions. Lungs CTAB.  No wheezes, rales or ronchi. Gastrointestinal: Obese with a large central well-healed scar from the pubic symphysis to the umbilicus.  Soft, and nondistended.  Tender to palpation in the left lower quadrant.  No guarding or rebound.  No peritoneal signs. Musculoskeletal:  No LE edema.  Neurologic:  A&Ox3.  Speech is clear.  Face and smile are symmetric.  EOMI.  Moves all extremities well. Skin:  Skin is warm, dry and intact. No rash noted. Psychiatric: Mood and affect are normal. Speech and behavior are normal.  Normal judgement.  ____________________________________________   LABS (all labs ordered are listed, but only abnormal results are displayed)  Labs Reviewed  CBC -  Abnormal; Notable for the following components:      Result Value   WBC 11.8 (*)    All other components within normal limits  COMPREHENSIVE METABOLIC PANEL - Abnormal; Notable for the following components:   Glucose, Bld 274 (*)    All other components within normal limits  URINALYSIS, COMPLETE (UACMP) WITH MICROSCOPIC - Abnormal; Notable for the following components:   Color, Urine YELLOW (*)    APPearance HAZY (*)    Glucose, UA 50 (*)    Ketones, ur 5 (*)    Bacteria, UA RARE (*)    All other components within normal limits  URINE CULTURE  LIPASE, BLOOD   ____________________________________________  EKG  Not indicated ____________________________________________  RADIOLOGY  Ct Abdomen Pelvis W Contrast  Result Date: 11/21/2018 CLINICAL DATA:  Lower abdominal pain EXAM: CT ABDOMEN AND PELVIS WITH CONTRAST TECHNIQUE: Multidetector CT imaging of the abdomen and pelvis was performed using the standard protocol following bolus administration of intravenous contrast. CONTRAST:  128mL ISOVUE-300 IOPAMIDOL (ISOVUE-300) INJECTION 61% COMPARISON:  CT 10/24/2018 FINDINGS: Lower chest: Lung bases demonstrate no acute consolidation or effusion. Normal heart size. Hepatobiliary: Hepatic steatosis. No calcified gallstone or biliary dilatation Pancreas: Unremarkable. No pancreatic ductal dilatation or surrounding inflammatory changes. Spleen: Enlarged, measuring up to 15 cm. Adrenals/Urinary Tract: Adrenal glands are unremarkable. Kidneys are normal, without renal calculi, focal lesion, or hydronephrosis. Bladder is unremarkable. Stomach/Bowel: Stomach within normal limits. No dilated small bowel. Sigmoid colon diverticular disease. Mild edema within the fat adjacent to the sigmoid colon, consistent with mild diverticulitis. Vascular/Lymphatic: Nonaneurysmal aorta. Mild aortic atherosclerosis. No significantly enlarged lymph nodes Reproductive: Status post hysterectomy. No adnexal masses. Other:  Negative for free air or free fluid. Small fat containing periumbilical hernia. Small fat containing supraumbilical ventral hernia. Musculoskeletal: No acute or suspicious abnormality IMPRESSION: 1. Mild wall thickening and inflammatory change at the sigmoid colon with diverticula present, consistent with an acute diverticulitis. No perforation. No abscess. 2. Hepatic steatosis 3. Splenomegaly Electronically Signed   By: Donavan Foil M.D.   On: 11/21/2018 22:01    ____________________________________________   PROCEDURES  Procedure(s) performed: None  Procedures  Critical Care performed: No ____________________________________________   INITIAL IMPRESSION / ASSESSMENT AND PLAN / ED COURSE  Pertinent labs & imaging results that were available during my care of the patient were reviewed by me and considered in my medical decision making (see chart for details).  38 y.o. female with a history of partial colon resection with delayed takedown due to diverticulitis complication, status post appendectomy, history of renal colic, history of hysterectomy for endometrial cancer, presenting for left lower quadrant pain, nausea without vomiting.  Overall, the patient is tachycardic and in significant discomfort so we will treat her pain.  I am concerned about diverticulitis although other etiologies exist including partial small bowel obstruction, renal colic, or urinary infection.  The patient's blood work shows hyperglycemia without DKA.  An elevated white blood cell count at 11.8, no evidence of anemia.  Her sodium and potassium are reassuring.  Her lipase is within normal limits.  She  has bacteriuria with squamous cells, without any other evidence of UTI.  We will send this for culture; I do not suspect this is the primary cause of her symptoms today, but the antibiotics that she receives will cover her for urinary pathogens anyway.  Plan reevaluation for final  disposition.  ----------------------------------------- 10:34 PM on 11/21/2018 -----------------------------------------  Patient has a CT scan which shows uncomplicated diverticulitis.  However, she is still having significant pain even after 2 doses of Dilaudid, an antiemetic and intravenous fluids.  I will plan to admit her to the hospital for ongoing treatment by the hospitalist.    ____________________________________________  FINAL CLINICAL IMPRESSION(S) / ED DIAGNOSES  Final diagnoses:  Diverticulitis         NEW MEDICATIONS STARTED DURING THIS VISIT:  New Prescriptions   No medications on file      Eula Listen, MD 11/21/18 2120    Eula Listen, MD 11/21/18 2235

## 2018-11-21 NOTE — ED Triage Notes (Signed)
PT in with co left lower abd pain that started last night, hx of diverticulitis states feels the same.

## 2018-11-22 LAB — GLUCOSE, CAPILLARY
GLUCOSE-CAPILLARY: 284 mg/dL — AB (ref 70–99)
Glucose-Capillary: 206 mg/dL — ABNORMAL HIGH (ref 70–99)
Glucose-Capillary: 230 mg/dL — ABNORMAL HIGH (ref 70–99)
Glucose-Capillary: 235 mg/dL — ABNORMAL HIGH (ref 70–99)
Glucose-Capillary: 155 mg/dL — ABNORMAL HIGH (ref 70–99)

## 2018-11-22 MED ORDER — INSULIN ASPART 100 UNIT/ML ~~LOC~~ SOLN
0.0000 [IU] | Freq: Three times a day (TID) | SUBCUTANEOUS | Status: DC
  Administered 2018-11-22 – 2018-11-23 (×2): 3 [IU] via SUBCUTANEOUS
  Administered 2018-11-23: 5 [IU] via SUBCUTANEOUS
  Administered 2018-11-23 – 2018-11-24 (×2): 3 [IU] via SUBCUTANEOUS
  Filled 2018-11-22 (×5): qty 1

## 2018-11-22 MED ORDER — ENOXAPARIN SODIUM 40 MG/0.4ML ~~LOC~~ SOLN
40.0000 mg | SUBCUTANEOUS | Status: DC
  Administered 2018-11-23 (×2): 40 mg via SUBCUTANEOUS
  Filled 2018-11-22 (×3): qty 0.4

## 2018-11-22 MED ORDER — SODIUM CHLORIDE 0.9 % IV SOLN
INTRAVENOUS | Status: DC
  Administered 2018-11-22 – 2018-11-23 (×3): via INTRAVENOUS

## 2018-11-22 MED ORDER — MORPHINE SULFATE (PF) 2 MG/ML IV SOLN
2.0000 mg | INTRAVENOUS | Status: DC | PRN
  Administered 2018-11-22 – 2018-11-23 (×8): 2 mg via INTRAVENOUS
  Filled 2018-11-22 (×8): qty 1

## 2018-11-22 MED ORDER — ACETAMINOPHEN 325 MG PO TABS: 650.0000 mg | ORAL_TABLET | Freq: Four times a day (QID) | ORAL | Status: DC | PRN

## 2018-11-22 MED ORDER — OXYCODONE HCL 5 MG PO TABS
5.0000 mg | ORAL_TABLET | ORAL | Status: DC | PRN
  Filled 2018-11-22: qty 1

## 2018-11-22 MED ORDER — KETOROLAC TROMETHAMINE 15 MG/ML IJ SOLN
15.0000 mg | Freq: Four times a day (QID) | INTRAMUSCULAR | Status: DC | PRN
  Administered 2018-11-22 – 2018-11-24 (×4): 15 mg via INTRAVENOUS
  Filled 2018-11-22 (×5): qty 1

## 2018-11-22 MED ORDER — ONDANSETRON HCL 4 MG/2ML IJ SOLN
4.0000 mg | Freq: Four times a day (QID) | INTRAMUSCULAR | Status: DC | PRN
  Administered 2018-11-23 (×2): 4 mg via INTRAVENOUS
  Filled 2018-11-22 (×2): qty 2

## 2018-11-22 MED ORDER — ONDANSETRON HCL 4 MG PO TABS
4.0000 mg | ORAL_TABLET | Freq: Four times a day (QID) | ORAL | Status: DC | PRN
  Administered 2018-11-22: 4 mg via ORAL
  Filled 2018-11-22: qty 1

## 2018-11-22 MED ORDER — INSULIN ASPART PROT & ASPART (70-30 MIX) 100 UNIT/ML ~~LOC~~ SUSP: 20.0000 [IU] | Freq: Every day | SUBCUTANEOUS | Status: DC

## 2018-11-22 MED ORDER — INSULIN ASPART 100 UNIT/ML ~~LOC~~ SOLN
0.0000 [IU] | Freq: Every day | SUBCUTANEOUS | Status: DC
  Administered 2018-11-23: 2 [IU] via SUBCUTANEOUS
  Filled 2018-11-22: qty 1

## 2018-11-22 MED ORDER — ACETAMINOPHEN 650 MG RE SUPP: 650.0000 mg | Freq: Four times a day (QID) | RECTAL | Status: DC | PRN

## 2018-11-22 MED ORDER — INSULIN DETEMIR 100 UNIT/ML ~~LOC~~ SOLN
20.0000 [IU] | Freq: Every day | SUBCUTANEOUS | Status: DC
  Administered 2018-11-22 – 2018-11-23 (×3): 20 [IU] via SUBCUTANEOUS
  Filled 2018-11-22 (×4): qty 0.2

## 2018-11-22 NOTE — Progress Notes (Signed)
Inpatient Diabetes Program Recommendations  AACE/ADA: New Consensus Statement on Inpatient Glycemic Control  Target Ranges:  Prepandial:   less than 140 mg/dL      Peak postprandial:   less than 180 mg/dL (1-2 hours)      Critically ill patients:  140 - 180 mg/dL   Results for Erica Bush, Erica Bush (MRN [id number]) as of 11/22/2018 11:06  Ref. Range 11/21/2018 20:01  Glucose Latest Ref Range: 70 - 99 mg/dL 274 (H)   Review of Glycemic Control  Diabetes history: DM2 Outpatient Diabetes medications: 70/30 30-40 units with supper Current orders for Inpatient glycemic control: Levemir 20 units QHS, Novolog 0-9 units TID with meals  Inpatient Diabetes Program Recommendations:   Correction (SSI): Please consider ordering Novolog 0-5 units QHS for bedtime correction.  HbgA1C: Please consider ordering an A1C to evaluate glycemic control over the past 2-3 months.  Thanks, Barnie Alderman, RN, MSN, CDE Diabetes Coordinator Inpatient Diabetes Program [phone] (Team Pager from 8am to 5pm)

## 2018-11-22 NOTE — ED Notes (Signed)
ED TO INPATIENT HANDOFF REPORT  Name/Age/Gender Erica Bush 38 y.o. female  Code Status Full  Home/SNF/Other Home  Chief Complaint diverticulitis  Level of Care/Admitting Diagnosis ED Disposition    ED Disposition Condition [facility] Area: [place] [100120]  Level of Care: Med-Surg [16]  Diagnosis: Diverticulitis [017494]  Admitting Physician: Odessa Fleming  Attending Physician: Odessa Fleming  Estimated length of stay: past midnight tomorrow  Certification:: I certify this patient will need inpatient services for at least 2 midnights  PT Class (Do Not Modify): Inpatient [101]  PT Acc Code (Do Not Modify): Private [1]       Medical History Past Medical History:  Diagnosis Date  . Cancer ([place])   . Diabetes mellitus without complication ([facility])     Allergies Allergies  Allergen Reactions  . Ciprofloxacin Shortness Of Breath and Nausea And Vomiting    Shortness of breath  . Dexamethasone Sodium Phosphate Anaphylaxis, Rash and Swelling    IV Location/Drains/Wounds Patient Lines/Drains/Airways Status   Active Line/Drains/Airways    Name:   Placement date:   Placement time:   Site:   Days:   Peripheral IV 11/21/18 Left Forearm   11/21/18    2130    Forearm   1          Labs/Imaging Results for orders placed or performed during the hospital encounter of 11/21/18 (from the past 48 hour(s))  CBC     Status: Abnormal   Collection Time: 11/21/18  8:01 PM  Result Value Ref Range   WBC 11.8 (H) 4.0 - 10.5 K/uL   RBC 4.61 3.87 - 5.11 MIL/uL   Hemoglobin 14.2 12.0 - 15.0 g/dL   HCT 40.7 36.0 - 46.0 %   MCV 88.3 80.0 - 100.0 fL   MCH 30.8 26.0 - 34.0 pg   MCHC 34.9 30.0 - 36.0 g/dL   RDW 11.7 11.5 - 15.5 %   Platelets 310 150 - 400 K/uL   nRBC 0.0 0.0 - 0.2 %    Comment: Performed at [facility], [address] [place], [place] [address]  Comprehensive metabolic panel     Status: Abnormal    Collection Time: 11/21/18  8:01 PM  Result Value Ref Range   Sodium 136 135 - 145 mmol/L   Potassium 3.6 3.5 - 5.1 mmol/L   Chloride 104 98 - 111 mmol/L   CO2 24 22 - 32 mmol/L   Glucose, Bld 274 (H) 70 - 99 mg/dL   BUN 10 6 - 20 mg/dL   Creatinine, Ser 0.54 0.44 - 1.00 mg/dL   Calcium 9.0 8.9 - 10.3 mg/dL   Total Protein 7.6 6.5 - 8.1 g/dL   Albumin 4.1 3.5 - 5.0 g/dL   AST 30 15 - 41 U/L   ALT 43 0 - 44 U/L   Alkaline Phosphatase 97 38 - 126 U/L   Total Bilirubin 0.8 0.3 - 1.2 mg/dL   GFR calc non Af Amer >60 >60 mL/min   GFR calc Af Amer >60 >60 mL/min   Anion gap 8 5 - 15    Comment: Performed at [facility], [address] [place], [place] [address]  Lipase, blood     Status: None   Collection Time: 11/21/18  8:01 PM  Result Value Ref Range   Lipase 29 11 - 51 U/L    Comment: Performed at [facility], [address] [place], [place] [address]  Urinalysis,  Complete w Microscopic     Status: Abnormal   Collection Time: 11/21/18  8:01 PM  Result Value Ref Range   Color, Urine YELLOW (A) YELLOW   APPearance HAZY (A) CLEAR   Specific Gravity, Urine 1.029 1.005 - 1.030   pH 5.0 5.0 - 8.0   Glucose, UA 50 (A) NEGATIVE mg/dL   Hgb urine dipstick NEGATIVE NEGATIVE   Bilirubin Urine NEGATIVE NEGATIVE   Ketones, ur 5 (A) NEGATIVE mg/dL   Protein, ur NEGATIVE NEGATIVE mg/dL   Nitrite NEGATIVE NEGATIVE   Leukocytes, UA NEGATIVE NEGATIVE   RBC / HPF 6-10 0 - 5 RBC/hpf   WBC, UA 0-5 0 - 5 WBC/hpf   Bacteria, UA RARE (A) NONE SEEN   Squamous Epithelial / LPF 11-20 0 - 5   Mucus PRESENT    Ca Oxalate Crys, UA PRESENT     Comment: Performed at [facility], [place], [place] [address]   Ct Abdomen Pelvis W Contrast  Result Date: 11/21/2018 CLINICAL DATA:  Lower abdominal pain EXAM: CT ABDOMEN AND PELVIS WITH CONTRAST TECHNIQUE: Multidetector CT imaging of the abdomen and pelvis was performed using the standard protocol following  bolus administration of intravenous contrast. CONTRAST:  186mL ISOVUE-300 IOPAMIDOL (ISOVUE-300) INJECTION 61% COMPARISON:  CT 10/24/2018 FINDINGS: Lower chest: Lung bases demonstrate no acute consolidation or effusion. Normal heart size. Hepatobiliary: Hepatic steatosis. No calcified gallstone or biliary dilatation Pancreas: Unremarkable. No pancreatic ductal dilatation or surrounding inflammatory changes. Spleen: Enlarged, measuring up to 15 cm. Adrenals/Urinary Tract: Adrenal glands are unremarkable. Kidneys are normal, without renal calculi, focal lesion, or hydronephrosis. Bladder is unremarkable. Stomach/Bowel: Stomach within normal limits. No dilated small bowel. Sigmoid colon diverticular disease. Mild edema within the fat adjacent to the sigmoid colon, consistent with mild diverticulitis. Vascular/Lymphatic: Nonaneurysmal aorta. Mild aortic atherosclerosis. No significantly enlarged lymph nodes Reproductive: Status post hysterectomy. No adnexal masses. Other: Negative for free air or free fluid. Small fat containing periumbilical hernia. Small fat containing supraumbilical ventral hernia. Musculoskeletal: No acute or suspicious abnormality IMPRESSION: 1. Mild wall thickening and inflammatory change at the sigmoid colon with diverticula present, consistent with an acute diverticulitis. No perforation. No abscess. 2. Hepatic steatosis 3. Splenomegaly Electronically Signed   By: Donavan Foil M.D.   On: 11/21/2018 22:01    Pending Labs Unresulted Labs (From admission, onward)    Start     Ordered   11/21/18 2121  Urine culture  Add-on,   AD     11/21/18 2120   Signed and Held  CBC  (enoxaparin (LOVENOX)    CrCl >/= 30 ml/min)  Once,   R    Comments:  Baseline for enoxaparin therapy IF NOT ALREADY DRAWN.  Notify MD if PLT < 100 K.    Signed and Held   Signed and Held  Creatinine, serum  (enoxaparin (LOVENOX)    CrCl >/= 30 ml/min)  Once,   R    Comments:  Baseline for enoxaparin therapy IF NOT  ALREADY DRAWN.    Signed and Held   Signed and Held  Creatinine, serum  (enoxaparin (LOVENOX)    CrCl >/= 30 ml/min)  Weekly,   R    Comments:  while on enoxaparin therapy    Signed and Held          Vitals/Pain Today's Vitals   11/21/18 2116 11/21/18 2204 11/21/18 2218 11/21/18 2252  BP:   110/63   Pulse:   92   Resp:   20  Temp:   98 F (36.7 C)   TempSrc:   Oral   SpO2:   99%   Weight:      Height:      PainSc: 9  8  9  9      Isolation Precautions No active isolations  Medications Medications  Ampicillin-Sulbactam (UNASYN) 3 g in sodium chloride 0.9 % 100 mL IVPB (has no administration in time range)  insulin aspart (novoLOG) injection 0-9 Units (has no administration in time range)  HYDROmorphone (DILAUDID) injection 1 mg (1 mg Intravenous Given 11/21/18 2133)  ondansetron (ZOFRAN) injection 4 mg (4 mg Intravenous Given 11/21/18 2132)  sodium chloride 0.9 % bolus 1,000 mL (0 mLs Intravenous Stopped 11/21/18 2252)  cefTRIAXone (ROCEPHIN) 1 g in sodium chloride 0.9 % 100 mL IVPB (0 g Intravenous Stopped 11/21/18 2252)  metroNIDAZOLE (FLAGYL) IVPB 500 mg (0 mg Intravenous Stopped 11/21/18 2219)  iopamidol (ISOVUE-300) 61 % injection 100 mL (100 mLs Intravenous Contrast Given 11/21/18 2141)  HYDROmorphone (DILAUDID) injection 1 mg (1 mg Intravenous Given 11/21/18 2244)    Mobility Walks

## 2018-11-22 NOTE — Progress Notes (Signed)
[facility] [place] NAME: Erica Bush    MR#:  [id number]  DATE OF BIRTH:  1981/03/03  SUBJECTIVE: admitted for sigmoid colon diverticulitis, less abdominal pain, wants to advance diet.  CHIEF COMPLAINT:   Chief Complaint  Patient presents with  . Abdominal Pain    REVIEW OF SYSTEMS:   ROS CONSTITUTIONAL: No fever, fatigue or weakness.  EYES: No blurred or double vision.  EARS, NOSE, AND THROAT: No tinnitus or ear pain.  RESPIRATORY: No cough, shortness of breath, wheezing or hemoptysis.  CARDIOVASCULAR: No chest pain, orthopnea, edema.  GASTROINTESTINAL: No nausea or vomiting.  Has abdominal pain in the left lower quadrant but better than yesterday.Marland Kitchen  GENITOURINARY: No dysuria, hematuria.  ENDOCRINE: No polyuria, nocturia,  HEMATOLOGY: No anemia, easy bruising or bleeding SKIN: No rash or lesion. MUSCULOSKELETAL: No joint pain or arthritis.   NEUROLOGIC: No tingling, numbness, weakness.  PSYCHIATRY: No anxiety or depression.   DRUG ALLERGIES:   Allergies  Allergen Reactions  . Ciprofloxacin Shortness Of Breath and Nausea And Vomiting    Shortness of breath  . Dexamethasone Sodium Phosphate Anaphylaxis, Rash and Swelling    VITALS:  Blood pressure 118/76, pulse 70, temperature 98.6 F (37 C), temperature source Oral, resp. rate 18, height 5\' 2"  (1.575 m), weight 78.2 kg, SpO2 98 %.  PHYSICAL EXAMINATION:  GENERAL:  38 y.o.-year-old patient lying in the bed with no acute distress.  EYES: Pupils equal, round, reactive to light and accommodation. No scleral icterus. Extraocular muscles intact.  HEENT: Head atraumatic, normocephalic. Oropharynx and nasopharynx clear.  NECK:  Supple, no jugular venous distention. No thyroid enlargement, no tenderness.  LUNGS: Normal breath sounds bilaterally, no wheezing, rales,rhonchi or crepitation. No use of accessory muscles of respiration.  CARDIOVASCULAR: S1, S2 normal. No  murmurs, rubs, or gallops.  ABDOMEN: Left lower quadrant abdominal pain improved.  Bowel sounds  Present. EXTREMITIES: No pedal edema, cyanosis, or clubbing.  NEUROLOGIC: Cranial nerves II through XII are intact. Muscle strength 5/5 in all extremities. Sensation intact. Gait not checked.  PSYCHIATRIC: The patient is alert and oriented x 3.  SKIN: No obvious rash, lesion, or ulcer.    LABORATORY PANEL:   CBC Recent Labs  Lab 11/21/18 2001  WBC 11.8*  HGB 14.2  HCT 40.7  PLT 310   ------------------------------------------------------------------------------------------------------------------  Chemistries  Recent Labs  Lab 11/21/18 2001  NA 136  K 3.6  CL 104  CO2 24  GLUCOSE 274*  BUN 10  CREATININE 0.54  CALCIUM 9.0  AST 30  ALT 43  ALKPHOS 97  BILITOT 0.8   ------------------------------------------------------------------------------------------------------------------  Cardiac Enzymes No results for input(s): TROPONINI in the last 168 hours. ------------------------------------------------------------------------------------------------------------------  RADIOLOGY:  Ct Abdomen Pelvis W Contrast  Result Date: 11/21/2018 CLINICAL DATA:  Lower abdominal pain EXAM: CT ABDOMEN AND PELVIS WITH CONTRAST TECHNIQUE: Multidetector CT imaging of the abdomen and pelvis was performed using the standard protocol following bolus administration of intravenous contrast. CONTRAST:  128mL ISOVUE-300 IOPAMIDOL (ISOVUE-300) INJECTION 61% COMPARISON:  CT 10/24/2018 FINDINGS: Lower chest: Lung bases demonstrate no acute consolidation or effusion. Normal heart size. Hepatobiliary: Hepatic steatosis. No calcified gallstone or biliary dilatation Pancreas: Unremarkable. No pancreatic ductal dilatation or surrounding inflammatory changes. Spleen: Enlarged, measuring up to 15 cm. Adrenals/Urinary Tract: Adrenal glands are unremarkable. Kidneys are normal, without renal calculi, focal lesion,  or hydronephrosis. Bladder is unremarkable. Stomach/Bowel: Stomach within normal limits. No dilated small bowel. Sigmoid colon diverticular disease. Mild edema within  the fat adjacent to the sigmoid colon, consistent with mild diverticulitis. Vascular/Lymphatic: Nonaneurysmal aorta. Mild aortic atherosclerosis. No significantly enlarged lymph nodes Reproductive: Status post hysterectomy. No adnexal masses. Other: Negative for free air or free fluid. Small fat containing periumbilical hernia. Small fat containing supraumbilical ventral hernia. Musculoskeletal: No acute or suspicious abnormality IMPRESSION: 1. Mild wall thickening and inflammatory change at the sigmoid colon with diverticula present, consistent with an acute diverticulitis. No perforation. No abscess. 2. Hepatic steatosis 3. Splenomegaly Electronically Signed   By: Donavan Foil M.D.   On: 11/21/2018 22:01    EKG:  No orders found for this or any previous visit.  ASSESSMENT AND PLAN:  1.  Left lower quadrant abdominal pain  Due to acute sigmoid colon diverticulitis, improved, continue IV antibiotics for another 24 hours, advance diet to soft diet, Likely discharge home with oral antibiotics tomorrow.  #2. diabetes mellitus type 2: Started on Levemir, will order hemoglobin A1c, seen by diabetes coordinator, recommended to add NovoLog 0 to 5 units nightly, likely discharge home tomorrow and she can resume her  nph 70/30/  All the records are reviewed and case discussed with Care Management/Social Workerr. Management plans discussed with the patient, family and they are in agreement.  CODE STATUS: Full code  TOTAL TIME TAKING CARE OF THIS PATIENT: 35 minutes.   Likely discharge home tomorrow.   Epifanio Lesches M.D on 11/22/2018 at 1:48 PM  Between 7am to 6pm - Pager - [phone]  After 6pm go to [email or web address] - password [email or web address] [facility]  Office  [phone]  CC: Primary care physician; Patient,  No Pcp Per   Note: This dictation was prepared with Dragon dictation along with smaller phrase technology. Any transcriptional errors that result from this process are unintentional.

## 2018-11-23 LAB — HEMOGLOBIN A1C
Hgb A1c MFr Bld: 8.9 % — ABNORMAL HIGH (ref 4.8–5.6)
Mean Plasma Glucose: 208.73 mg/dL

## 2018-11-23 LAB — GLUCOSE, CAPILLARY
Glucose-Capillary: 210 mg/dL — ABNORMAL HIGH (ref 70–99)
Glucose-Capillary: 213 mg/dL — ABNORMAL HIGH (ref 70–99)
Glucose-Capillary: 270 mg/dL — ABNORMAL HIGH (ref 70–99)
Glucose-Capillary: 232 mg/dL — ABNORMAL HIGH (ref 70–99)

## 2018-11-23 LAB — URINE CULTURE: Culture: 20000 — AB

## 2018-11-23 MED ORDER — MORPHINE SULFATE (PF) 2 MG/ML IV SOLN
2.0000 mg | Freq: Once | INTRAVENOUS | Status: AC
  Administered 2018-11-23: 2 mg via INTRAVENOUS
  Filled 2018-11-23: qty 1

## 2018-11-23 MED ORDER — TRAMADOL HCL 50 MG PO TABS
50.0000 mg | ORAL_TABLET | Freq: Four times a day (QID) | ORAL | Status: DC | PRN
  Administered 2018-11-23 (×2): 50 mg via ORAL
  Filled 2018-11-23 (×2): qty 1

## 2018-11-23 MED ORDER — SODIUM CHLORIDE 0.9 % IV SOLN
INTRAVENOUS | Status: DC | PRN
  Administered 2018-11-23: 250 mL via INTRAVENOUS

## 2018-11-23 NOTE — Progress Notes (Signed)
Per patient, she cannot take oxycodone. Tramadol order received.

## 2018-11-23 NOTE — Progress Notes (Addendum)
[facility] [place] NAME: Erica Bush    MR#:  [id number]  DATE OF BIRTH:  01-04-1981  SUBJECTIVE: admitted for sigmoid colon diverticulitis, still  has left lower quadrant abdominal pain,  CHIEF COMPLAINT:   Chief Complaint  Patient presents with  . Abdominal Pain    REVIEW OF SYSTEMS:   ROS CONSTITUTIONAL: No fever, fatigue or weakness.  EYES: No blurred or double vision.  EARS, NOSE, AND THROAT: No tinnitus or ear pain.  RESPIRATORY: No cough, shortness of breath, wheezing or hemoptysis.  CARDIOVASCULAR: No chest pain, orthopnea, edema.  GASTROINTESTINAL: Patient has nausea, left lower quadrant abdominal pain. GENITOURINARY: No dysuria, hematuria.  ENDOCRINE: No polyuria, nocturia,  HEMATOLOGY: No anemia, easy bruising or bleeding SKIN: No rash or lesion. MUSCULOSKELETAL: No joint pain or arthritis.   NEUROLOGIC: No tingling, numbness, weakness.  PSYCHIATRY: No anxiety or depression.   DRUG ALLERGIES:   Allergies  Allergen Reactions  . Ciprofloxacin Shortness Of Breath and Nausea And Vomiting    Shortness of breath  . Dexamethasone Sodium Phosphate Anaphylaxis, Rash and Swelling    VITALS:  Blood pressure 104/72, pulse 73, temperature 98.2 F (36.8 C), temperature source Oral, resp. rate 16, height 5\' 2"  (1.575 m), weight 78.2 kg, SpO2 97 %.  PHYSICAL EXAMINATION:  GENERAL:  38 y.o.-year-old patient lying in the bed with no acute distress.  EYES: Pupils equal, round, reactive to light and accommodation. No scleral icterus. Extraocular muscles intact.  HEENT: Head atraumatic, normocephalic. Oropharynx and nasopharynx clear.  NECK:  Supple, no jugular venous distention. No thyroid enlargement, no tenderness.  LUNGS: Normal breath sounds bilaterally, no wheezing, rales,rhonchi or crepitation. No use of accessory muscles of respiration.  CARDIOVASCULAR: S1, S2 normal. No murmurs, rubs, or gallops.  ABDOMEN: Left  lower quadrant abdominal pain improved.  Bowel sounds  Present. EXTREMITIES: No pedal edema, cyanosis, or clubbing.  NEUROLOGIC: Cranial nerves II through XII are intact. Muscle strength 5/5 in all extremities. Sensation intact. Gait not checked.  PSYCHIATRIC: The patient is alert and oriented x 3.  SKIN: No obvious rash, lesion, or ulcer.    LABORATORY PANEL:   CBC Recent Labs  Lab 11/21/18 2001  WBC 11.8*  HGB 14.2  HCT 40.7  PLT 310   ------------------------------------------------------------------------------------------------------------------  Chemistries  Recent Labs  Lab 11/21/18 2001  NA 136  K 3.6  CL 104  CO2 24  GLUCOSE 274*  BUN 10  CREATININE 0.54  CALCIUM 9.0  AST 30  ALT 43  ALKPHOS 97  BILITOT 0.8   ------------------------------------------------------------------------------------------------------------------  Cardiac Enzymes No results for input(s): TROPONINI in the last 168 hours. ------------------------------------------------------------------------------------------------------------------  RADIOLOGY:  Ct Abdomen Pelvis W Contrast  Result Date: 11/21/2018 CLINICAL DATA:  Lower abdominal pain EXAM: CT ABDOMEN AND PELVIS WITH CONTRAST TECHNIQUE: Multidetector CT imaging of the abdomen and pelvis was performed using the standard protocol following bolus administration of intravenous contrast. CONTRAST:  163mL ISOVUE-300 IOPAMIDOL (ISOVUE-300) INJECTION 61% COMPARISON:  CT 10/24/2018 FINDINGS: Lower chest: Lung bases demonstrate no acute consolidation or effusion. Normal heart size. Hepatobiliary: Hepatic steatosis. No calcified gallstone or biliary dilatation Pancreas: Unremarkable. No pancreatic ductal dilatation or surrounding inflammatory changes. Spleen: Enlarged, measuring up to 15 cm. Adrenals/Urinary Tract: Adrenal glands are unremarkable. Kidneys are normal, without renal calculi, focal lesion, or hydronephrosis. Bladder is unremarkable.  Stomach/Bowel: Stomach within normal limits. No dilated small bowel. Sigmoid colon diverticular disease. Mild edema within the fat adjacent to the sigmoid colon, consistent with  mild diverticulitis. Vascular/Lymphatic: Nonaneurysmal aorta. Mild aortic atherosclerosis. No significantly enlarged lymph nodes Reproductive: Status post hysterectomy. No adnexal masses. Other: Negative for free air or free fluid. Small fat containing periumbilical hernia. Small fat containing supraumbilical ventral hernia. Musculoskeletal: No acute or suspicious abnormality IMPRESSION: 1. Mild wall thickening and inflammatory change at the sigmoid colon with diverticula present, consistent with an acute diverticulitis. No perforation. No abscess. 2. Hepatic steatosis 3. Splenomegaly Electronically Signed   By: Donavan Foil M.D.   On: 11/21/2018 22:01    EKG:  No orders found for this or any previous visit.  ASSESSMENT AND PLAN:  1.  Left lower quadrant abdominal pain  Due to acute sigmoid colon diverticulitis, i he has significant abdominal pain, continue IV antibiotics, conservative treatment, back off on diet if she feels her regular diet is making her abdominal pain worse.  Same explained to the patient.  Leukocytosis is improving.  Check labs tomorrow.  Discontinue IV morphine, continue oxycodone 5 mg every 4 hours as needed for pain, as well as Toradol, discontinue IV fluids, continue IV Unasyn.  #2. diabetes mellitus type 2: Started on Levemir, will order hemoglobin A1c, seen by diabetes coordinator, recommended to add NovoLog 0 to 5 units nightly,   Stable likely discharge home tomorrow with oral antibiotics.  All the records are reviewed and case discussed with Care Management/Social Workerr. Management plans discussed with the patient, family and they are in agreement.  CODE STATUS: Full code  TOTAL TIME TAKING CARE OF THIS PATIENT: 40  minutes.  More than 50% time spent in counseling, coordination of  care Likely discharge home tomorrow.   Epifanio Lesches M.D on 11/23/2018 at 12:50 PM  Between 7am to 6pm - Pager - [phone]  After 6pm go to [email or web address] - password [email or web address] [facility]  Office  [phone]  CC: Primary care physician; Patient, No Pcp Per   Note: This dictation was prepared with Dragon dictation along with smaller phrase technology. Any transcriptional errors that result from this process are unintentional.

## 2018-11-24 LAB — CBC
HCT: 35.3 % — ABNORMAL LOW (ref 36.0–46.0)
Hemoglobin: 12.4 g/dL (ref 12.0–15.0)
MCH: 30.6 pg (ref 26.0–34.0)
MCHC: 35.1 g/dL (ref 30.0–36.0)
MCV: 87.2 fL (ref 80.0–100.0)
PLATELETS: 248 10*3/uL (ref 150–400)
RBC: 4.05 MIL/uL (ref 3.87–5.11)
RDW: 11.7 % (ref 11.5–15.5)
WBC: 8.2 10*3/uL (ref 4.0–10.5)
nRBC: 0 % (ref 0.0–0.2)

## 2018-11-24 LAB — BASIC METABOLIC PANEL
Anion gap: 7 (ref 5–15)
BUN: 9 mg/dL (ref 6–20)
CO2: 22 mmol/L (ref 22–32)
Calcium: 7.9 mg/dL — ABNORMAL LOW (ref 8.9–10.3)
Chloride: 106 mmol/L (ref 98–111)
Creatinine, Ser: 0.59 mg/dL (ref 0.44–1.00)
GFR calc Af Amer: >60 mL/min (ref >60)
GFR calc non Af Amer: >60 mL/min (ref >60)
GLUCOSE: 257 mg/dL — AB (ref 70–99)
Potassium: 3.4 mmol/L — ABNORMAL LOW (ref 3.5–5.1)
Sodium: 135 mmol/L (ref 135–145)

## 2018-11-24 LAB — GLUCOSE, CAPILLARY: Glucose-Capillary: 217 mg/dL — ABNORMAL HIGH (ref 70–99)

## 2018-11-24 MED ORDER — TRAMADOL HCL 50 MG PO TABS: 50.0000 mg | ORAL_TABLET | Freq: Four times a day (QID) | ORAL | 0 refills | Status: AC | PRN

## 2018-11-24 MED ORDER — AMOXICILLIN-POT CLAVULANATE 875-125 MG PO TABS: 1.0000 | ORAL_TABLET | Freq: Two times a day (BID) | ORAL | 0 refills | Status: AC

## 2018-11-24 NOTE — Progress Notes (Signed)
     Erica Bush was admitted to the Hospital on 11/21/2018 and Discharged  11/24/2018 and should be excused from work/school   for 5  days starting 11/21/2018 , may return to work/school without any restrictions.    Epifanio Lesches M.D on 11/24/2018,at 9:25 AM

## 2018-11-24 NOTE — Progress Notes (Signed)
Discharge order received. Patient is alert and oriented. Vital signs stable . No signs of acute distress. Discharge instructions given. Patient verbalized understanding. No other issues noted at this time.   

## 2018-12-01 NOTE — Discharge Summary (Signed)
Erica Bush, is a 38 y.o. female  DOB Apr 15, 1981  MRN [id number].  Admission date:  11/21/2018  Admitting Physician  Fritzi Mandes, MD  Discharge Date:  12/01/2018   Primary MD  Patient, No Pcp Per  Recommendations for primary care physician for things to follow:   [facility] for her medication needs.   Admission Diagnosis  Diverticulitis [K57.92]   Discharge Diagnosis  Diverticulitis [K57.92]    Active Problems:   Diverticulitis      Past Medical History:  Diagnosis Date  . Cancer ([place])   . Diabetes mellitus without complication [facility])     Past Surgical History:  Procedure Laterality Date  . ABDOMINAL HYSTERECTOMY         History of present illness and  Hospital Course:     Kindly see H&P for history of present illness and admission details, please review complete Labs, Consult reports and Test reports for all details in brief  HPI  from the history and physical done on the day of admission 38 year old female patient admitted for left lower quadrant abdominal pain, found to have sigmoid colon diverticulitis, admitted for the same.   Hospital Course  #1 acute left lower quadrant abdominal pain due to sigmoid colon diverticulitis, patient admitted to hospitalist service, started on Unasyn, IV fluids, IV pain medicines, initially was n.p.o. after that abdominal pain slightly improved, he also had leukocytosis on admission improved.  Patient wanted to eat liquids and then advance to regular food, discharged home with oral antibiotics with Augmentin.  For 7 days.  Patient continues to request pain medicines given that she was eating for without pain.  Patient discharged home with limited supply of oxycodone.  Suspect narcotic seeking behavior.  We told the patient that  she needs to see [facility], she was following with [facility] clinics before she came.  But no PCP here. 2.  Diabetes mellitus type 2: Patient is on Novolin and 70/30 at 30 to 40 units daily with supper, patient told me that she buys insulin at [facility].  But she needs to see [facility] clinics for her diabetes management.    Discharge Condition: Stable   Follow UP  PCP in 1 week, patient was following with [facility] clinics advised her to keep this appointment.   Discharge Instructions  and  Discharge Medications      Allergies as of 11/24/2018      Reactions   Ciprofloxacin Shortness Of Breath, Nausea And Vomiting   Shortness of breath   Dexamethasone Sodium Phosphate Anaphylaxis, Rash, Swelling      Medication List    STOP taking these medications   ondansetron 4 MG disintegrating tablet Commonly known as:  ZOFRAN ODT     TAKE these medications   amoxicillin-clavulanate 875-125 MG tablet Commonly known as:  AUGMENTIN Take 1 tablet by mouth 2 (two) times daily for 7 days.   insulin NPH-regular Human (70-30) 100 UNIT/ML injection Inject 30-40 Units into the skin daily with supper.   traMADol 50 MG tablet Commonly known as:  ULTRAM Take 1 tablet (50 mg total) by mouth every 6 (six) hours as needed for severe pain.         Diet and Activity recommendation: See Discharge Instructions above   Consults obtained -full code   Major procedures and Radiology Reports - PLEASE review detailed and final reports for all details, in brief -      Ct Abdomen Pelvis W Contrast  Result Date: 11/21/2018  CLINICAL DATA:  Lower abdominal pain EXAM: CT ABDOMEN AND PELVIS WITH CONTRAST TECHNIQUE: Multidetector CT imaging of the abdomen and pelvis was performed using the standard protocol following bolus administration of intravenous contrast. CONTRAST:  151mL ISOVUE-300 IOPAMIDOL (ISOVUE-300) INJECTION 61% COMPARISON:  CT 10/24/2018 FINDINGS: Lower chest: Lung bases demonstrate no acute consolidation or  effusion. Normal heart size. Hepatobiliary: Hepatic steatosis. No calcified gallstone or biliary dilatation Pancreas: Unremarkable. No pancreatic ductal dilatation or surrounding inflammatory changes. Spleen: Enlarged, measuring up to 15 cm. Adrenals/Urinary Tract: Adrenal glands are unremarkable. Kidneys are normal, without renal calculi, focal lesion, or hydronephrosis. Bladder is unremarkable. Stomach/Bowel: Stomach within normal limits. No dilated small bowel. Sigmoid colon diverticular disease. Mild edema within the fat adjacent to the sigmoid colon, consistent with mild diverticulitis. Vascular/Lymphatic: Nonaneurysmal aorta. Mild aortic atherosclerosis. No significantly enlarged lymph nodes Reproductive: Status post hysterectomy. No adnexal masses. Other: Negative for free air or free fluid. Small fat containing periumbilical hernia. Small fat containing supraumbilical ventral hernia. Musculoskeletal: No acute or suspicious abnormality IMPRESSION: 1. Mild wall thickening and inflammatory change at the sigmoid colon with diverticula present, consistent with an acute diverticulitis. No perforation. No abscess. 2. Hepatic steatosis 3. Splenomegaly Electronically Signed   By: Donavan Foil M.D.   On: 11/21/2018 22:01    Micro Results    Recent Results (from the past 240 hour(s))  Urine culture     Status: Abnormal   Collection Time: 11/21/18  8:01 PM  Result Value Ref Range Status   Specimen Description URINE, RANDOM  Final   Special Requests   Final    NONE Performed at [facility], [address] [place], [place] [address]    Culture (A)  Final    20,000 COLONIES/mL MULTIPLE SPECIES PRESENT, SUGGEST RECOLLECTION    Report Status 11/23/2018 FINAL  Final       Today   Subjective:   Pepper Kerrick today has no headache,no chest abdominal pain,no new weakness tingling or numbness, feels much better wants to go home today.   Objective:   Blood pressure 116/83, pulse 71,  temperature 98.6 F (37 C), temperature source Oral, resp. rate 20, height 5\' 2"  (1.575 m), weight 78.2 kg, SpO2 96 %.  No intake or output data in the 24 hours ending 12/01/18 0751  Exam Awake Alert, Oriented x 3, No new F.N deficits, Normal affect [place].AT,PERRAL Supple Neck,No JVD, No cervical lymphadenopathy appriciated.  Symmetrical Chest wall movement, Good air movement bilaterally, CTAB RRR,No Gallops,Rubs or new Murmurs, No Parasternal Heave +ve B.Sounds, Abd Soft, Non tender, No organomegaly appriciated, No rebound -guarding or rigidity. No Cyanosis, Clubbing or edema, No new Rash or bruise  Data Review   CBC w Diff:  Lab Results  Component Value Date   WBC 8.2 11/24/2018   HGB 12.4 11/24/2018   HCT 35.3 (L) 11/24/2018   PLT 248 11/24/2018    CMP:  Lab Results  Component Value Date   NA 135 11/24/2018   K 3.4 (L) 11/24/2018   CL 106 11/24/2018   CO2 22 11/24/2018   BUN 9 11/24/2018   CREATININE 0.59 11/24/2018   PROT 7.6 11/21/2018   ALBUMIN 4.1 11/21/2018   BILITOT 0.8 11/21/2018   ALKPHOS 97 11/21/2018   AST 30 11/21/2018   ALT 43 11/21/2018  .   Total Time in preparing paper work, data evaluation and todays exam - 33 minutes  Epifanio Lesches M.D on 11/24/2018 at 7:51 AM    Note: This dictation was prepared  with Dragon dictation along with smaller phrase technology. Any transcriptional errors that result from this process are unintentional.

## 2019-07-02 ENCOUNTER — Other Ambulatory Visit: Payer: Self-pay | Admitting: *Deleted

## 2019-07-02 DIAGNOSIS — Z20822 Contact with and (suspected) exposure to covid-19: Secondary | ICD-10-CM

## 2019-07-04 LAB — NOVEL CORONAVIRUS, NAA: SARS-CoV-2, NAA: NOT DETECTED

## 2019-08-01 ENCOUNTER — Telehealth: Payer: Self-pay

## 2019-08-01 NOTE — Telephone Encounter (Signed)
Pre-visit screening attempted prior to [facility] appointment on 08/06/2019. No answer / msg left - will try to reach pt again tomorrow.

## 2019-08-02 ENCOUNTER — Other Ambulatory Visit: Payer: Self-pay

## 2019-08-02 ENCOUNTER — Telehealth: Payer: Self-pay

## 2019-08-02 NOTE — Telephone Encounter (Signed)
2nd attempt for pre-screening call prior to [facility] appointment on 08/07/2019. Call went straight to voicemail - left voicemail.

## 2019-08-07 ENCOUNTER — Ambulatory Visit
Admission: RE | Admit: 2019-08-07 | Discharge: 2019-08-07 | Disposition: A | Discharge status: Home / Self Care | Payer: Self-pay | Source: Ambulatory Visit | Attending: Oncology | Admitting: Oncology

## 2019-08-07 ENCOUNTER — Other Ambulatory Visit: Payer: Self-pay

## 2019-08-07 ENCOUNTER — Ambulatory Visit: Payer: Self-pay | Attending: Oncology | Admitting: *Deleted

## 2019-08-07 ENCOUNTER — Encounter: Payer: Self-pay | Admitting: *Deleted

## 2019-08-07 VITALS — BP 107/78 | HR 88 | Temp 98.1°F | Ht 62.0 in | Wt 166.1 lb

## 2019-08-07 DIAGNOSIS — Z9189 Other specified personal risk factors, not elsewhere classified: Secondary | ICD-10-CM

## 2019-08-07 DIAGNOSIS — Z0000 Encounter for general adult medical examination without abnormal findings: Secondary | ICD-10-CM

## 2019-08-07 HISTORY — DX: Malignant neoplasm of endometrium: C54.1

## 2019-08-07 NOTE — Patient Instructions (Signed)
Gave patient hand-out, Women Staying Healthy, Active and Well from [facility], with education on breast health, pap smears, heart and colon health. 

## 2019-08-07 NOTE — Progress Notes (Signed)
  Subjective:     Patient ID: Erica Bush, female   DOB: 08/19/1981, 38 y.o.   MRN: [id number]  HPI   Review of Systems     Objective:   Physical Exam Neck:   Chest:     Breasts: Breasts are symmetrical.        Right: Mass present. No swelling, bleeding, inverted nipple, nipple discharge, skin change or tenderness.        Left: Mass present. No swelling, bleeding, inverted nipple, nipple discharge, skin change or tenderness.    Lymphadenopathy:     Upper Body:     Right upper body: No supraclavicular or axillary adenopathy.     Left upper body: No supraclavicular or axillary adenopathy.        Assessment:     38 year old [place] female presents to [facility] with complaints of finding a breast mass 7-8 months ago.  States she feels like it is getting larger.  On clinical breast exam I can palpate a 0.5 cm mass at 12:00 right breast 4-5 CFN and a similar <0.5 cm mass at 1:00 left breast 4-5 CFN.  Taught self breast awareness.  Patient has a personal history of endometrial cancer at age 51.  She had a hysterectomy at that time.  She is nulliparous. Patient's mom also had endometrial cancer at age 24.  Tyrer-Cuzick breast cancer risk assessment with a lifetime risk of 12.6%.  Per NCCN guidelines no further imaging is recommended.  However, patient meets NCCN guidelines genetic testing.  I have discussed this with the patient and she would like to pursue a referral to our high risk clinic to discuss genetic testing. Patient has been screened for eligibility.  She does not have any insurance, Medicare or Medicaid.  She also meets financial eligibility.  Hand-out given on the Affordable Care Act.     Plan:     Bilateral diagnostic mammogram and ultrasound ordered to evaluation palpable masses.  If no findings on imaging the patient and I discussed that since there is symmetry in palpable findings, she would continue to monitor via self exam and call me back if any changes.   Referral placed to high risk clinic.  Will follow-up per [facility] protocol.

## 2019-08-20 ENCOUNTER — Encounter: Payer: Self-pay | Admitting: *Deleted

## 2019-08-22 ENCOUNTER — Encounter: Payer: Self-pay | Admitting: *Deleted

## 2019-08-22 NOTE — Progress Notes (Signed)
Left patient a message to return my call.  Breast imaging was a birads 2 with an oil cyst noted in the right breast.  No notation of any findings in left.  Would like to offer repeat breast exam in a couple of months.  She is scheduled to see Dr. Grayland Ormond in November for high risk clinic.  Patient meets NCCN guidelines for genetic testing based on personal and family history of endometrial cancer.

## 2019-09-13 DIAGNOSIS — Z9189 Other specified personal risk factors, not elsewhere classified: Secondary | ICD-10-CM | POA: Insufficient documentation

## 2019-09-13 NOTE — Progress Notes (Deleted)
[place]  Telephone:([phone]) [phone] Fax:(336) [phone]  ID: Erica Bush OB: 11/19/1980  MR#: [id number]  [id number]  Patient Care Team: Patient, No Pcp Per as PCP - General (General Practice) Rico Junker, RN as Registered Nurse Theodore Demark, RN as Registered Nurse  CHIEF COMPLAINT: High risk for breast cancer  INTERVAL HISTORY: ***  REVIEW OF SYSTEMS:   ROS  As per HPI. Otherwise, a complete review of systems is negative.  PAST MEDICAL HISTORY: Past Medical History:  Diagnosis Date  . Cancer ([place])   . Diabetes mellitus without complication ([place])   . Endometrial cancer [facility])    age 38    PAST SURGICAL HISTORY: Past Surgical History:  Procedure Laterality Date  . ABDOMINAL HYSTERECTOMY      FAMILY HISTORY: Family History  Problem Relation Age of Onset  . Endometrial cancer Mother     ADVANCED DIRECTIVES (Y/N):  N  HEALTH MAINTENANCE: Social History   Tobacco Use  . Smoking status: Current Every Day Smoker  . Smokeless tobacco: Never Used  Substance Use Topics  . Alcohol use: Not Currently  . Drug use: Not Currently     Colonoscopy:  PAP:  Bone density:  Lipid panel:  Allergies  Allergen Reactions  . Ciprofloxacin Shortness Of Breath and Nausea And Vomiting    Shortness of breath  . Dexamethasone Sodium Phosphate Anaphylaxis, Rash and Swelling    Current Outpatient Medications  Medication Sig Dispense Refill  . insulin NPH-regular Human (70-30) 100 UNIT/ML injection Inject 30-40 Units into the skin daily with supper.    . traMADol (ULTRAM) 50 MG tablet Take 1 tablet (50 mg total) by mouth every 6 (six) hours as needed for severe pain. 30 tablet 0   No current facility-administered medications for this visit.     OBJECTIVE: There were no vitals filed for this visit.   There is no height or weight on file to calculate BMI.    ECOG FS:{CHL ONC [id number]  General: Well-developed, well-nourished,  no acute distress. Eyes: Pink conjunctiva, anicteric sclera. HEENT: Normocephalic, moist mucous membranes, clear oropharnyx. Lungs: Clear to auscultation bilaterally. Heart: Regular rate and rhythm. No rubs, murmurs, or gallops. Abdomen: Soft, nontender, nondistended. No organomegaly noted, normoactive bowel sounds. Musculoskeletal: No edema, cyanosis, or clubbing. Neuro: Alert, answering all questions appropriately. Cranial nerves grossly intact. Skin: No rashes or petechiae noted. Psych: Normal affect. Lymphatics: No cervical, calvicular, axillary or inguinal LAD.   LAB RESULTS:  Lab Results  Component Value Date   NA 135 11/24/2018   K 3.4 (L) 11/24/2018   CL 106 11/24/2018   CO2 22 11/24/2018   GLUCOSE 257 (H) 11/24/2018   BUN 9 11/24/2018   CREATININE 0.59 11/24/2018   CALCIUM 7.9 (L) 11/24/2018   PROT 7.6 11/21/2018   ALBUMIN 4.1 11/21/2018   AST 30 11/21/2018   ALT 43 11/21/2018   ALKPHOS 97 11/21/2018   BILITOT 0.8 11/21/2018   GFRNONAA >60 11/24/2018   GFRAA >60 11/24/2018    Lab Results  Component Value Date   WBC 8.2 11/24/2018   HGB 12.4 11/24/2018   HCT 35.3 (L) 11/24/2018   MCV 87.2 11/24/2018   PLT 248 11/24/2018     STUDIES: No results found.  ASSESSMENT: High risk for breast cancer  PLAN:   1. High risk for breast cancer:   Patient expressed understanding and was in agreement with this plan. She also understands that She can call clinic at any time with any questions, concerns,  or complaints.   Cancer Staging No matching staging information was found for the patient.  Lloyd Huger, MD   09/13/2019 1:26 PM

## 2019-09-17 ENCOUNTER — Inpatient Hospital Stay: Payer: Self-pay | Admitting: Oncology

## 2019-10-03 ENCOUNTER — Encounter: Payer: Self-pay | Admitting: *Deleted

## 2019-10-03 NOTE — Progress Notes (Signed)
Tried to call patient but no answer.  Left patient a message to return my call.  I have mailed her a letter to inform her of an appointment to repeat her breast exam on 12/04/19 @ 9:30.

## 2019-12-04 ENCOUNTER — Ambulatory Visit: Payer: Self-pay | Attending: Oncology

## 2019-12-04 ENCOUNTER — Other Ambulatory Visit: Payer: Self-pay

## 2019-12-04 VITALS — BP 125/79 | HR 91 | Temp 98.0°F | Resp 18 | Ht <= 58 in | Wt 161.0 lb

## 2019-12-04 DIAGNOSIS — N63 Unspecified lump in unspecified breast: Secondary | ICD-10-CM

## 2019-12-04 NOTE — Progress Notes (Signed)
Patient returns to [facility] clinic for repeat CBE.  She was seen 08/06/20 by Tanya Nones RN with complaint of right breast pain, and bilateral breast masses.  Imaging showed right breast oil cyst. Patient continues to have similar complaints, and she is very anxious.  Palpated symmetrical, bilateral,  upper outer quadrant nodularity.  Right breast palpated a separate nodule at 10:30 which may be oil cyst noted on prior imaging.  Patient requests referral to Dr. Bary Castilla for consult, and possible ultrasound.  Appointment scheduled 12/10/19 at 3:00.  Directions, and phone number given to patient.

## 2020-02-10 ENCOUNTER — Ambulatory Visit: Payer: Self-pay

## 2020-02-17 ENCOUNTER — Ambulatory Visit: Payer: HRSA Program | Attending: Internal Medicine

## 2020-02-17 DIAGNOSIS — Z20822 Contact with and (suspected) exposure to covid-19: Secondary | ICD-10-CM | POA: Insufficient documentation

## 2020-02-18 LAB — NOVEL CORONAVIRUS, NAA: SARS-CoV-2, NAA: NOT DETECTED

## 2020-02-18 LAB — SARS-COV-2, NAA 2 DAY TAT

## 2020-02-20 NOTE — Progress Notes (Signed)
Per Dr. Dwyane Luo note, benign findings.  Patient to resume annual mammograms.

## 2020-06-08 ENCOUNTER — Other Ambulatory Visit: Payer: Self-pay

## 2020-06-08 ENCOUNTER — Inpatient Hospital Stay
Admission: EM | Admit: 2020-06-08 | Discharge: 2020-06-18 | DRG: 390 | Disposition: A | Discharge status: Home / Self Care | Payer: Self-pay | Attending: Internal Medicine | Admitting: Internal Medicine

## 2020-06-08 ENCOUNTER — Emergency Department: Payer: Self-pay

## 2020-06-08 DIAGNOSIS — Z8542 Personal history of malignant neoplasm of other parts of uterus: Secondary | ICD-10-CM

## 2020-06-08 DIAGNOSIS — E1165 Type 2 diabetes mellitus with hyperglycemia: Secondary | ICD-10-CM | POA: Diagnosis present

## 2020-06-08 DIAGNOSIS — Z4659 Encounter for fitting and adjustment of other gastrointestinal appliance and device: Secondary | ICD-10-CM

## 2020-06-08 DIAGNOSIS — Z794 Long term (current) use of insulin: Secondary | ICD-10-CM

## 2020-06-08 DIAGNOSIS — E860 Dehydration: Secondary | ICD-10-CM | POA: Diagnosis present

## 2020-06-08 DIAGNOSIS — Z9049 Acquired absence of other specified parts of digestive tract: Secondary | ICD-10-CM

## 2020-06-08 DIAGNOSIS — E876 Hypokalemia: Secondary | ICD-10-CM | POA: Diagnosis not present

## 2020-06-08 DIAGNOSIS — Z20822 Contact with and (suspected) exposure to covid-19: Secondary | ICD-10-CM | POA: Diagnosis present

## 2020-06-08 DIAGNOSIS — Z881 Allergy status to other antibiotic agents status: Secondary | ICD-10-CM

## 2020-06-08 DIAGNOSIS — F17200 Nicotine dependence, unspecified, uncomplicated: Secondary | ICD-10-CM | POA: Diagnosis present

## 2020-06-08 DIAGNOSIS — Z90710 Acquired absence of both cervix and uterus: Secondary | ICD-10-CM

## 2020-06-08 DIAGNOSIS — K760 Fatty (change of) liver, not elsewhere classified: Secondary | ICD-10-CM | POA: Diagnosis present

## 2020-06-08 DIAGNOSIS — Z8049 Family history of malignant neoplasm of other genital organs: Secondary | ICD-10-CM

## 2020-06-08 DIAGNOSIS — D72829 Elevated white blood cell count, unspecified: Secondary | ICD-10-CM | POA: Diagnosis present

## 2020-06-08 DIAGNOSIS — Z888 Allergy status to other drugs, medicaments and biological substances status: Secondary | ICD-10-CM

## 2020-06-08 DIAGNOSIS — K56609 Unspecified intestinal obstruction, unspecified as to partial versus complete obstruction: Secondary | ICD-10-CM | POA: Diagnosis present

## 2020-06-08 DIAGNOSIS — K5651 Intestinal adhesions [bands], with partial obstruction: Principal | ICD-10-CM | POA: Diagnosis present

## 2020-06-08 LAB — COMPREHENSIVE METABOLIC PANEL
ALT: 39 U/L (ref 0–44)
AST: 27 U/L (ref 15–41)
Albumin: 3.9 g/dL (ref 3.5–5.0)
Alkaline Phosphatase: 106 U/L (ref 38–126)
Anion gap: 12 (ref 5–15)
BUN: 8 mg/dL (ref 6–20)
CO2: 25 mmol/L (ref 22–32)
Calcium: 9.3 mg/dL (ref 8.9–10.3)
Chloride: 98 mmol/L (ref 98–111)
Creatinine, Ser: 0.48 mg/dL (ref 0.44–1.00)
GFR calc Af Amer: >60 mL/min (ref >60)
GFR calc non Af Amer: >60 mL/min (ref >60)
Glucose, Bld: 284 mg/dL — ABNORMAL HIGH (ref 70–99)
Potassium: 3.9 mmol/L (ref 3.5–5.1)
Sodium: 135 mmol/L (ref 135–145)
Total Bilirubin: 0.7 mg/dL (ref 0.3–1.2)
Total Protein: 7.5 g/dL (ref 6.5–8.1)

## 2020-06-08 LAB — URINALYSIS, COMPLETE (UACMP) WITH MICROSCOPIC
Bacteria, UA: NONE SEEN
Bilirubin Urine: NEGATIVE
Glucose, UA: 50 mg/dL — AB
Hgb urine dipstick: NEGATIVE
Ketones, ur: 5 mg/dL — AB
Leukocytes,Ua: NEGATIVE
Nitrite: NEGATIVE
Protein, ur: NEGATIVE mg/dL
Specific Gravity, Urine: >1.046 — ABNORMAL HIGH (ref 1.005–1.030)
pH: 5 (ref 5.0–8.0)

## 2020-06-08 LAB — CBC
HCT: 40.7 % (ref 36.0–46.0)
Hemoglobin: 14.7 g/dL (ref 12.0–15.0)
MCH: 32.2 pg (ref 26.0–34.0)
MCHC: 36.1 g/dL — ABNORMAL HIGH (ref 30.0–36.0)
MCV: 89.1 fL (ref 80.0–100.0)
Platelets: 296 10*3/uL (ref 150–400)
RBC: 4.57 MIL/uL (ref 3.87–5.11)
RDW: 11.9 % (ref 11.5–15.5)
WBC: 14 10*3/uL — ABNORMAL HIGH (ref 4.0–10.5)
nRBC: 0 % (ref 0.0–0.2)

## 2020-06-08 LAB — GLUCOSE, CAPILLARY
Glucose-Capillary: 255 mg/dL — ABNORMAL HIGH (ref 70–99)
Glucose-Capillary: 249 mg/dL — ABNORMAL HIGH (ref 70–99)
Glucose-Capillary: 282 mg/dL — ABNORMAL HIGH (ref 70–99)

## 2020-06-08 LAB — SARS CORONAVIRUS 2 BY RT PCR (HOSPITAL ORDER, PERFORMED IN ~~LOC~~ HOSPITAL LAB): SARS Coronavirus 2: NEGATIVE

## 2020-06-08 LAB — LIPASE, BLOOD: Lipase: 27 U/L (ref 11–51)

## 2020-06-08 MED ORDER — MAGNESIUM HYDROXIDE 400 MG/5ML PO SUSP: 30.0000 mL | Freq: Every day | ORAL | Status: DC | PRN

## 2020-06-08 MED ORDER — ACETAMINOPHEN 325 MG PO TABS
650.0000 mg | ORAL_TABLET | Freq: Four times a day (QID) | ORAL | Status: DC | PRN
  Administered 2020-06-12: 650 mg via ORAL
  Filled 2020-06-08: qty 2

## 2020-06-08 MED ORDER — IOHEXOL 350 MG/ML SOLN
75.0000 mL | Freq: Once | INTRAVENOUS | Status: AC | PRN
  Administered 2020-06-08: 75 mL via INTRAVENOUS
  Filled 2020-06-08: qty 75

## 2020-06-08 MED ORDER — ONDANSETRON HCL 4 MG/2ML IJ SOLN
4.0000 mg | Freq: Four times a day (QID) | INTRAMUSCULAR | Status: DC | PRN
  Administered 2020-06-08 – 2020-06-09 (×2): 4 mg via INTRAVENOUS
  Filled 2020-06-08 (×2): qty 2

## 2020-06-08 MED ORDER — ONDANSETRON HCL 4 MG PO TABS: 4.0000 mg | ORAL_TABLET | Freq: Four times a day (QID) | ORAL | Status: DC | PRN

## 2020-06-08 MED ORDER — SODIUM CHLORIDE 0.9 % IV BOLUS
1000.0000 mL | Freq: Once | INTRAVENOUS | Status: AC
  Administered 2020-06-08: 1000 mL via INTRAVENOUS

## 2020-06-08 MED ORDER — ENOXAPARIN SODIUM 40 MG/0.4ML ~~LOC~~ SOLN
40.0000 mg | SUBCUTANEOUS | Status: DC
  Administered 2020-06-08 – 2020-06-13 (×5): 40 mg via SUBCUTANEOUS
  Filled 2020-06-08 (×9): qty 0.4

## 2020-06-08 MED ORDER — HYDROMORPHONE HCL 1 MG/ML IJ SOLN
1.0000 mg | INTRAMUSCULAR | Status: DC | PRN
  Administered 2020-06-08 – 2020-06-10 (×7): 1 mg via INTRAVENOUS
  Filled 2020-06-08 (×9): qty 1

## 2020-06-08 MED ORDER — INSULIN ASPART 100 UNIT/ML ~~LOC~~ SOLN
0.0000 [IU] | Freq: Four times a day (QID) | SUBCUTANEOUS | Status: DC
  Administered 2020-06-08: 3 [IU] via SUBCUTANEOUS
  Filled 2020-06-08: qty 1

## 2020-06-08 MED ORDER — ONDANSETRON HCL 4 MG/2ML IJ SOLN
INTRAMUSCULAR | Status: AC
  Filled 2020-06-08: qty 2

## 2020-06-08 MED ORDER — ACETAMINOPHEN 650 MG RE SUPP: 650.0000 mg | Freq: Four times a day (QID) | RECTAL | Status: DC | PRN

## 2020-06-08 MED ORDER — FENTANYL CITRATE (PF) 100 MCG/2ML IJ SOLN
50.0000 ug | INTRAMUSCULAR | Status: AC | PRN
  Administered 2020-06-08 (×2): 50 ug via INTRAVENOUS
  Filled 2020-06-08 (×2): qty 2

## 2020-06-08 MED ORDER — SODIUM CHLORIDE 0.9 % IV SOLN: INTRAVENOUS | Status: DC

## 2020-06-08 MED ORDER — HYDROMORPHONE HCL 1 MG/ML IJ SOLN
1.0000 mg | Freq: Once | INTRAMUSCULAR | Status: AC
  Administered 2020-06-08: 1 mg via INTRAVENOUS
  Filled 2020-06-08: qty 1

## 2020-06-08 MED ORDER — TRAZODONE HCL 50 MG PO TABS
25.0000 mg | ORAL_TABLET | Freq: Every evening | ORAL | Status: DC | PRN
  Administered 2020-06-09: 25 mg via ORAL
  Filled 2020-06-08: qty 1

## 2020-06-08 MED ORDER — ONDANSETRON HCL 4 MG/2ML IJ SOLN
4.0000 mg | Freq: Once | INTRAMUSCULAR | Status: AC
  Administered 2020-06-08: 4 mg via INTRAVENOUS

## 2020-06-08 MED ORDER — MORPHINE SULFATE (PF) 4 MG/ML IV SOLN
4.0000 mg | Freq: Once | INTRAVENOUS | Status: AC
  Administered 2020-06-08: 4 mg via INTRAVENOUS
  Filled 2020-06-08: qty 1

## 2020-06-08 MED ORDER — ONDANSETRON HCL 4 MG/2ML IJ SOLN
4.0000 mg | Freq: Once | INTRAMUSCULAR | Status: AC
  Administered 2020-06-08: 4 mg via INTRAVENOUS
  Filled 2020-06-08: qty 2

## 2020-06-08 NOTE — ED Triage Notes (Addendum)
Pt comes via POV from home with c/o epigastric belly pain that started today. Pt states back and kidney pain on and off.  Pt also states BS in 500 recently.  Pt states N./V  Current [id number], pt vomiting in triage and states severe pain. Pt also states she hasn't been able to urinate

## 2020-06-08 NOTE — Progress Notes (Signed)
Briefly, this is a 39 y/o F with a remote history of hysterectomy, appendectomy, and partial colon resection who presented to the ED today with abdominal pain. Emesis in triage.  Having normal bowel function, but with mild leukocytosis and CT scan findings consistent with SBO. Of note, she apparently has also been unable to urinate and blood sugars have been poorly controlled.  Renal function normal on chemistry panel. Recommend hospitalist admission with surgery consultation.  NGT. NPO. IVF. Treat hyperglycemia. Consider UA/UCx.  Full consult note to follow.

## 2020-06-08 NOTE — Progress Notes (Signed)
Educated patient on the benefits of ngt insertion. Patient states that she will be unable to tolerate the ngt insertion at this time due to having a difficult time from a previous experience. Continues to decline the insertion after education completed. Pt. Continues with abdominal pain and intermittent nausea. Will continue to monitor.

## 2020-06-08 NOTE — ED Provider Notes (Signed)
[facility] Emergency Department Provider Note ____________________________________________   First MD Initiated Contact with Patient 06/08/20 1948     (approximate)  I have reviewed the triage vital signs and the nursing notes.   HISTORY  Chief Complaint Abdominal Pain and Back Pain    HPI Erica Bush is a 39 y.o. female with PMH as noted below including DM on insulin as well as a remote history of a ruptured appendicitis (status post partial colectomy) who presents with epigastric abdominal pain, acute onset around 6 hours ago, persistent course since then, worsening intensity, and now radiating to bilateral flanks.  The patient reports associated nausea and vomiting.  She has not had a bowel movement today.  She has no fever or chills.  She states that her sugar has also been running high recently despite her being compliant with her insulin.  She denies eating anything unusual recently, and does not drink alcohol.  Past Medical History:  Diagnosis Date  . Cancer ([place])   . Diabetes mellitus without complication ([place])   . Endometrial cancer [facility])    age 32    Patient Active Problem List   Diagnosis Date Noted  . At high risk for breast cancer 09/13/2019  . Diverticulitis 11/21/2018    Past Surgical History:  Procedure Laterality Date  . ABDOMINAL HYSTERECTOMY      Prior to Admission medications   Medication Sig Start Date End Date Taking? Authorizing Provider  insulin NPH-regular Human (70-30) 100 UNIT/ML injection Inject 30-40 Units into the skin daily with supper.    [provider]  traMADol (ULTRAM) 50 MG tablet Take 1 tablet (50 mg total) by mouth every 6 (six) hours as needed for severe pain. 11/24/18   Epifanio Lesches, MD    Allergies Ciprofloxacin and Dexamethasone sodium phosphate  Family History  Problem Relation Age of Onset  . Endometrial cancer Mother     Social History Social History   Tobacco  Use  . Smoking status: Current Every Day Smoker  . Smokeless tobacco: Never Used  Substance Use Topics  . Alcohol use: Not Currently  . Drug use: Not Currently    Review of Systems  Constitutional: No fever/chills. Eyes: No visual changes. ENT: No sore throat. Cardiovascular: Denies chest pain. Respiratory: Denies shortness of breath. Gastrointestinal: Positive for nausea and vomiting. Genitourinary: Negative for dysuria.  Musculoskeletal: Negative for back pain. Skin: Negative for rash. Neurological: Negative for headaches, focal weakness or numbness.   ____________________________________________   PHYSICAL EXAM:  VITAL SIGNS: ED Triage Vitals  Enc Vitals Group     BP 06/08/20 1611 120/75     Pulse Rate 06/08/20 1611 98     Resp 06/08/20 1611 18     Temp 06/08/20 1611 98 F (36.7 C)     Temp Source 06/08/20 1903 Oral     SpO2 06/08/20 1611 100 %     Weight --      Height --      Head Circumference --      Peak Flow --      Pain Score 06/08/20 1611 10     Pain Loc --      Pain Edu? --      Excl. in [place]? --     Constitutional: Alert and oriented.  Uncomfortable appearing but in no acute distress. Eyes: Conjunctivae are normal.  No scleral icterus. Head: Atraumatic. Nose: No congestion/rhinnorhea. Mouth/Throat: Mucous membranes are moist.   Neck: Normal range of motion.  Cardiovascular: Normal rate, regular rhythm. Good peripheral circulation. Respiratory: Normal respiratory effort.  No retractions. Gastrointestinal: Soft with moderate epigastric and bilateral mid abdominal tenderness.  Minimal distention.  Genitourinary: No flank tenderness. Musculoskeletal: Extremities warm and well perfused.  Neurologic:  Normal speech and language. No gross focal neurologic deficits are appreciated.  Skin:  Skin is warm and dry. No rash noted. Psychiatric: Mood and affect are normal. Speech and behavior are normal.  ____________________________________________    LABS (all labs ordered are listed, but only abnormal results are displayed)  Labs Reviewed  COMPREHENSIVE METABOLIC PANEL - Abnormal; Notable for the following components:      Result Value   Glucose, Bld 284 (*)    All other components within normal limits  CBC - Abnormal; Notable for the following components:   WBC 14.0 (*)    MCHC 36.1 (*)    All other components within normal limits  GLUCOSE, CAPILLARY - Abnormal; Notable for the following components:   Glucose-Capillary 255 (*)    All other components within normal limits  SARS CORONAVIRUS 2 BY RT PCR (HOSPITAL ORDER, [place] [facility])  LIPASE, BLOOD  URINALYSIS, COMPLETE (UACMP) WITH MICROSCOPIC   ____________________________________________  EKG  ED ECG REPORT I, Arta Silence, the attending physician, personally viewed and interpreted this ECG.  Date: 06/08/2020 EKG Time: 1608 Rate: 90 Rhythm: normal sinus rhythm QRS Axis: normal Intervals: normal ST/T Wave abnormalities: normal Narrative Interpretation: no evidence of acute ischemia  ____________________________________________  RADIOLOGY  CT abdomen: Small bowel obstruction  ____________________________________________   PROCEDURES  Procedure(s) performed: No  Procedures  Critical Care performed: No ____________________________________________   INITIAL IMPRESSION / ASSESSMENT AND PLAN / ED COURSE  Pertinent labs & imaging results that were available during my care of the patient were reviewed by me and considered in my medical decision making (see chart for details).  39 year old female with PMH as noted above including DM on insulin as well as remote history of ruptured appendicitis presents with primarily epigastric abdominal pain for the last several hours associated with nausea and vomiting.  I reviewed the past medical records in [place].  The patient was most recently admitted in early 2020 with diverticulitis.   She has had no prior visits this year.  On exam, she is uncomfortable but overall well-appearing.  Her vital signs are normal.  Abdomen soft with moderate epigastric tenderness.  Exam is otherwise as described above.  Differential includes gastritis, gastroparesis, pancreatitis, colitis, diverticulitis, or possible small bowel obstruction.  Initial lab work-up is within normal limits except for elevated glucose and WBC count.  We will obtain a CT for further evaluation, and give fluids, analgesia, and antiemetics.  ----------------------------------------- 7:53 PM on 06/08/2020 -----------------------------------------  CT shows a small bowel obstruction.  I counseled the patient on the results of the work-up and the plan of care, and I have ordered additional pain medication and an NG tube.  ----------------------------------------- 8:07 PM on 06/08/2020 -----------------------------------------  I discussed the case with Dr. Celine Ahr from general surgery who recommends admission to the hospitalist service with surgery following.  I then discussed the case with Dr. Sidney Ace for admission to the hospitalist service.  ____________________________________________   FINAL CLINICAL IMPRESSION(S) / ED DIAGNOSES  Final diagnoses:  Small bowel obstruction ([place])      NEW MEDICATIONS STARTED DURING THIS VISIT:  New Prescriptions   No medications on file     Note:  This document was prepared using Dragon voice recognition software and may  include unintentional dictation errors.    Arta Silence, MD 06/08/20 2007

## 2020-06-08 NOTE — ED Notes (Signed)
Pt in with nausea, abdominal pain, and back pain that she states is similar to pain she has had in the pat with kidney stones. States it came on suddenly while at work. Skin dry. Resp reg/unlabored currently. Pt laying calmly in bed. Grimace present when pt assisted from wheelchair to stretcher. Pt steady. Tender in medial/upper abdomen. Reports BMs have been normal. States hasn't been able to urinate since earlier today and has been drinking a lot of water.

## 2020-06-08 NOTE — H&P (Addendum)
[place]   PATIENT NAME: Erica Bush    MR#:  [id number]  DATE OF BIRTH:  04/20/1981  DATE OF ADMISSION:  06/08/2020  PRIMARY CARE PHYSICIAN: Patient, No Pcp Per   REQUESTING/REFERRING PHYSICIAN: Arta Silence, MD  CHIEF COMPLAINT:   Chief Complaint  Patient presents with  . Abdominal Pain  . Back Pain    HISTORY OF PRESENT ILLNESS:  Erica Bush  is a 39 y.o. Caucasian female with a known history of type 2 diabetes mellitus and endometrial cancer status post hysterectomy and status post appendectomy for ruptured appendix in the past and partial colon resection, who presented to the emergency room with acute onset of epigastric pain extending to the left lower quadrant with recurrent vomiting since this a.m.  She admitted to chills and diaphoresis with mild tactile fever though she was afebrile here.  Her last normal bowel movement was last night and she had a small one this morning.  No dysuria, oliguria or hematuria or flank pain.  She denies any chest pain or dyspnea or palpitations or cough.  Upon presentation to the emergency room, vital signs were within normal.  Labs revealed a blood glucose of 284 and lipase of twenty-seven with otherwise normal BMP.  CBC showed leukocytosis of 14.  COVID-19 is currently pending.  EKG showed normal sinus rhythm with rate of 90.  Down pelvic CT scan revealed the following: 1. Segmental dilatation of the proximal jejunum, with transition from dilated to nondilated jejunum within the central upper pelvis. Findings are consistent with small bowel obstruction. 2. Mild diffuse hepatic steatosis. 3. Diverticulosis without diverticulitis.  The patient was given 4 mg of IV morphine sulfate, 4 mg of IV Zofran twice, 1 mg of IV Dilaudid and was ordered 50 mcg of as needed IV fentanyl.  She was also given 1 L bolus of IV normal saline.  She will be admitted to a medical bed for further evaluation and management. PAST MEDICAL HISTORY:     Past Medical History:  Diagnosis Date  . Cancer ([place])   . Diabetes mellitus without complication ([place])   . Endometrial cancer [facility])    age 37    PAST SURGICAL HISTORY:   Past Surgical History:  Procedure Laterality Date  . ABDOMINAL HYSTERECTOMY    Partial colon resection and appendectomy for ruptured appendix.  SOCIAL HISTORY:   Social History   Tobacco Use  . Smoking status: Current Every Day Smoker  . Smokeless tobacco: Never Used  Substance Use Topics  . Alcohol use: Not Currently    FAMILY HISTORY:   Family History  Problem Relation Age of Onset  . Endometrial cancer Mother     DRUG ALLERGIES:   Allergies  Allergen Reactions  . Ciprofloxacin Shortness Of Breath and Nausea And Vomiting    Shortness of breath  . Dexamethasone Sodium Phosphate Anaphylaxis, Rash and Swelling    REVIEW OF SYSTEMS:   ROS As per history of present illness. All pertinent systems were reviewed above. Constitutional, HEENT, cardiovascular, respiratory, GI, GU, musculoskeletal, neuro, psychiatric, endocrine, integumentary and hematologic systems were reviewed and are otherwise negative/unremarkable except for positive findings mentioned above in the HPI.   MEDICATIONS AT HOME:   Prior to Admission medications   Medication Sig Start Date End Date Taking? Authorizing Provider  insulin NPH-regular Bush (70-30) 100 UNIT/ML injection Inject 30-40 Units into the skin daily with supper.    [provider]  traMADol (ULTRAM) 50 MG tablet Take 1 tablet (  50 mg total) by mouth every 6 (six) hours as needed for severe pain. 11/24/18   Epifanio Lesches, MD      VITAL SIGNS:  Blood pressure 116/83, pulse 88, temperature 97.7 F (36.5 C), temperature source Oral, resp. rate 18, SpO2 96 %.  PHYSICAL EXAMINATION:  Physical Exam  GENERAL:  39 y.o.-year-old Caucasian female patient lying in the bed with mild distress due to pain. EYES: Pupils equal, round, reactive to light and  accommodation. No scleral icterus. Extraocular muscles intact.  HEENT: Head atraumatic, normocephalic. Oropharynx and nasopharynx clear.  NECK:  Supple, no jugular venous distention. No thyroid enlargement, no tenderness.  LUNGS: Normal breath sounds bilaterally, no wheezing, rales,rhonchi or crepitation. No use of accessory muscles of respiration.  CARDIOVASCULAR: Regular rate and rhythm, S1, S2 normal. No murmurs, rubs, or gallops.  ABDOMEN: Soft, nondistended with epigastric and left lower quadrant tenderness without rebound tenderness guarding or rigidity.  Bowel sounds were diminished.  No organomegaly or mass.  EXTREMITIES: No pedal edema, cyanosis, or clubbing.  NEUROLOGIC: Cranial nerves II through XII are intact. Muscle strength 5/5 in all extremities. Sensation intact. Gait not checked.  PSYCHIATRIC: The patient is alert and oriented x 3.  Normal affect and good eye contact. SKIN: No obvious rash, lesion, or ulcer.   LABORATORY PANEL:   CBC Recent Labs  Lab 06/08/20 1616  WBC 14.0*  HGB 14.7  HCT 40.7  PLT 296   ------------------------------------------------------------------------------------------------------------------  Chemistries  Recent Labs  Lab 06/08/20 1616  NA 135  K 3.9  CL 98  CO2 25  GLUCOSE 284*  BUN 8  CREATININE 0.48  CALCIUM 9.3  AST 27  ALT 39  ALKPHOS 106  BILITOT 0.7   ------------------------------------------------------------------------------------------------------------------  Cardiac Enzymes No results for input(s): TROPONINI in the last 168 hours. ------------------------------------------------------------------------------------------------------------------  RADIOLOGY:  CT ABDOMEN PELVIS W CONTRAST  Result Date: 06/08/2020 CLINICAL DATA:  Bilateral flank and upper abdominal pain EXAM: CT ABDOMEN AND PELVIS WITH CONTRAST TECHNIQUE: Multidetector CT imaging of the abdomen and pelvis was performed using the standard protocol  following bolus administration of intravenous contrast. CONTRAST:  4mL OMNIPAQUE IOHEXOL 350 MG/ML SOLN COMPARISON:  11/21/2018 FINDINGS: Lower chest: No acute pleural or parenchymal lung disease. Hepatobiliary: There is mild diffuse hepatic steatosis. No focal liver abnormality. The gallbladder is unremarkable. No biliary dilation. Pancreas: Unremarkable. No pancreatic ductal dilatation or surrounding inflammatory changes. Spleen: Normal in size without focal abnormality. Adrenals/Urinary Tract: Adrenal glands are unremarkable. Kidneys are normal, without renal calculi, focal lesion, or hydronephrosis. Bladder is unremarkable. Stomach/Bowel: There is segmental dilatation of the proximal jejunum, measuring up to 3.4 cm in diameter. Transition from dilated to nondilated jejunum is seen within the central upper pelvis reference image 62/2. There is scattered diverticulosis of the distal colon without diverticulitis. Evidence of prior partial sigmoid colon resection and reanastomosis. The appendix is not identified and may be surgically absent. Vascular/Lymphatic: Surgical clips are seen within the retroperitoneum consistent with lymphadenectomy. No pathologic adenopathy. No significant vascular findings are present. Reproductive: Status post hysterectomy. No adnexal masses. Other: No free fluid or free gas. No abdominal wall hernia. Postsurgical changes from prior laparotomy. Musculoskeletal: No acute or destructive bony lesions. Reconstructed images demonstrate no additional findings. IMPRESSION: 1. Segmental dilatation of the proximal jejunum, with transition from dilated to nondilated jejunum within the central upper pelvis. Findings are consistent with small bowel obstruction. 2. Mild diffuse hepatic steatosis. 3. Diverticulosis without diverticulitis. Electronically Signed   By: Randa Ngo M.D.   On:  06/08/2020 19:41      IMPRESSION AND PLAN:   1.  Small bowel obstruction likely secondary to  adhesions. -The patient will be admitted to a medical bed. There should be kept n.p.o. -NG tube will be placed. -Two-view abdomen x-ray will be ordered for follow-up in a.m. -General surgery consult to be obtained. -Dr. Celine Ahr was notified and is aware about the patient.  2.  Uncontrolled type 2 diabetes mellitus. -The patient will be placed on supplemental coverage with NovoLog. -We will continue her basal coverage at half home dose when she is n.p.o.  3.  Leukocytosis. -This is likely stress demargination secondary to #1. -Patient CBC will be followed. -Urinalysis was ordered and came back with elevated specific gravity otherwise unremarkable  4.  DVT prophylaxis. -Subcutaneous Lovenox.   All the records are reviewed and case discussed with ED provider. The plan of care was discussed in details with the patient (and family). I answered all questions. The patient agreed to proceed with the above mentioned plan. Further management will depend upon hospital course.   CODE STATUS: Full code  Status is: Inpatient  Remains inpatient appropriate because:Ongoing active pain requiring inpatient pain management, Ongoing diagnostic testing needed not appropriate for outpatient work up, Unsafe d/c plan, IV treatments appropriate due to intensity of illness or inability to take PO and Inpatient level of care appropriate due to severity of illness   Dispo: The patient is from: Home              Anticipated d/c is to: Home              Anticipated d/c date is: 2 days              Patient currently is not medically stable to d/c.   TOTAL TIME TAKING CARE OF THIS PATIENT: 55 minutes.    Christel Mormon M.D on 06/08/2020 at 8:20 PM  [facility]   From 7 PM-7 AM, contact night-coverage [email or web address]  CC: Primary care physician; Patient, No Pcp Per   Note: This dictation was prepared with Dragon dictation along with smaller phrase technology. Any transcriptional typo errors that  result from this process are unintentional.

## 2020-06-09 ENCOUNTER — Inpatient Hospital Stay: Payer: Self-pay

## 2020-06-09 DIAGNOSIS — E1165 Type 2 diabetes mellitus with hyperglycemia: Secondary | ICD-10-CM | POA: Diagnosis present

## 2020-06-09 LAB — CBC
HCT: 38.8 % (ref 36.0–46.0)
Hemoglobin: 13.7 g/dL (ref 12.0–15.0)
MCH: 32.1 pg (ref 26.0–34.0)
MCHC: 35.3 g/dL (ref 30.0–36.0)
MCV: 90.9 fL (ref 80.0–100.0)
Platelets: 268 10*3/uL (ref 150–400)
RBC: 4.27 MIL/uL (ref 3.87–5.11)
RDW: 11.9 % (ref 11.5–15.5)
WBC: 12.9 10*3/uL — ABNORMAL HIGH (ref 4.0–10.5)
nRBC: 0 % (ref 0.0–0.2)

## 2020-06-09 LAB — BASIC METABOLIC PANEL
Anion gap: 10 (ref 5–15)
BUN: 7 mg/dL (ref 6–20)
CO2: 24 mmol/L (ref 22–32)
Calcium: 8.5 mg/dL — ABNORMAL LOW (ref 8.9–10.3)
Chloride: 99 mmol/L (ref 98–111)
Creatinine, Ser: 0.59 mg/dL (ref 0.44–1.00)
GFR calc Af Amer: >60 mL/min (ref >60)
GFR calc non Af Amer: >60 mL/min (ref >60)
Glucose, Bld: 362 mg/dL — ABNORMAL HIGH (ref 70–99)
Potassium: 4.2 mmol/L (ref 3.5–5.1)
Sodium: 133 mmol/L — ABNORMAL LOW (ref 135–145)

## 2020-06-09 LAB — HEMOGLOBIN A1C
Hgb A1c MFr Bld: 8.9 % — ABNORMAL HIGH (ref 4.8–5.6)
Mean Plasma Glucose: 208.73 mg/dL

## 2020-06-09 LAB — GLUCOSE, CAPILLARY
Glucose-Capillary: 157 mg/dL — ABNORMAL HIGH (ref 70–99)
Glucose-Capillary: 172 mg/dL — ABNORMAL HIGH (ref 70–99)
Glucose-Capillary: 249 mg/dL — ABNORMAL HIGH (ref 70–99)
Glucose-Capillary: 377 mg/dL — ABNORMAL HIGH (ref 70–99)

## 2020-06-09 LAB — MAGNESIUM: Magnesium: 2 mg/dL (ref 1.7–2.4)

## 2020-06-09 LAB — HIV ANTIBODY (ROUTINE TESTING W REFLEX): HIV Screen 4th Generation wRfx: NONREACTIVE

## 2020-06-09 MED ORDER — ONDANSETRON HCL 4 MG PO TABS: 4.0000 mg | ORAL_TABLET | ORAL | Status: DC | PRN

## 2020-06-09 MED ORDER — INSULIN ASPART 100 UNIT/ML ~~LOC~~ SOLN
0.0000 [IU] | SUBCUTANEOUS | Status: DC
  Administered 2020-06-09 (×2): 4 [IU] via SUBCUTANEOUS
  Administered 2020-06-09: 7 [IU] via SUBCUTANEOUS
  Administered 2020-06-09: 20 [IU] via SUBCUTANEOUS
  Administered 2020-06-10: 7 [IU] via SUBCUTANEOUS
  Administered 2020-06-10 (×2): 4 [IU] via SUBCUTANEOUS
  Administered 2020-06-10: 3 [IU] via SUBCUTANEOUS
  Administered 2020-06-11 (×2): 4 [IU] via SUBCUTANEOUS
  Administered 2020-06-11: 3 [IU] via SUBCUTANEOUS
  Administered 2020-06-12: 4 [IU] via SUBCUTANEOUS
  Administered 2020-06-12: 3 [IU] via SUBCUTANEOUS
  Administered 2020-06-12 – 2020-06-13 (×4): 4 [IU] via SUBCUTANEOUS
  Administered 2020-06-13 (×2): 3 [IU] via SUBCUTANEOUS
  Administered 2020-06-13 – 2020-06-14 (×2): 4 [IU] via SUBCUTANEOUS
  Administered 2020-06-14: 3 [IU] via SUBCUTANEOUS
  Administered 2020-06-14: 4 [IU] via SUBCUTANEOUS
  Administered 2020-06-14 – 2020-06-15 (×5): 3 [IU] via SUBCUTANEOUS
  Administered 2020-06-16: 7 [IU] via SUBCUTANEOUS
  Administered 2020-06-16: 4 [IU] via SUBCUTANEOUS
  Administered 2020-06-17: 3 [IU] via SUBCUTANEOUS
  Administered 2020-06-17: 4 [IU] via SUBCUTANEOUS
  Administered 2020-06-17: 11 [IU] via SUBCUTANEOUS
  Administered 2020-06-17: 4 [IU] via SUBCUTANEOUS
  Administered 2020-06-17: 7 [IU] via SUBCUTANEOUS
  Filled 2020-06-09 (×37): qty 1

## 2020-06-09 MED ORDER — ONDANSETRON HCL 4 MG/2ML IJ SOLN
4.0000 mg | Freq: Four times a day (QID) | INTRAMUSCULAR | Status: DC | PRN
  Administered 2020-06-10 – 2020-06-14 (×5): 4 mg via INTRAVENOUS
  Filled 2020-06-09 (×7): qty 2

## 2020-06-09 MED ORDER — LIDOCAINE VISCOUS HCL 2 % MT SOLN
15.0000 mL | Freq: Once | OROMUCOSAL | Status: AC
  Administered 2020-06-09: 15 mL via OROMUCOSAL
  Filled 2020-06-09: qty 15

## 2020-06-09 MED ORDER — LIDOCAINE-EPINEPHRINE-TETRACAINE (LET) TOPICAL GEL
3.0000 mL | Freq: Once | TOPICAL | Status: AC
  Administered 2020-06-09: 3 mL via TOPICAL
  Filled 2020-06-09 (×2): qty 3

## 2020-06-09 MED ORDER — INSULIN GLARGINE 100 UNIT/ML ~~LOC~~ SOLN
10.0000 [IU] | Freq: Every day | SUBCUTANEOUS | Status: DC
  Filled 2020-06-09 (×10): qty 0.1

## 2020-06-09 MED ORDER — INSULIN ASPART 100 UNIT/ML ~~LOC~~ SOLN
0.0000 [IU] | SUBCUTANEOUS | Status: DC
Start: 2020-06-09 — End: 2020-06-09

## 2020-06-09 MED ORDER — LORAZEPAM 2 MG/ML IJ SOLN
2.0000 mg | Freq: Once | INTRAMUSCULAR | Status: AC
  Administered 2020-06-09: 2 mg via INTRAVENOUS
  Filled 2020-06-09: qty 1

## 2020-06-09 NOTE — Consult Note (Addendum)
[facility] SURGICAL CONSULTATION NOTE (initial) - cpt: 55732 (Outpatient/ED)   HISTORY OF PRESENT ILLNESS (HPI):  39 y.o. female presented to [facility] ED yesterday for evaluation of abdominal pain. Patient reports around a 24 hour history of worsening abdominal pian located in her epigastrium and LLQ. She described this as feeling a pressure sensation "like she has to move something along her intestines." This has been constant since the onset. She endorses associated nausea and dry heaving. No fever, chills, CP, SOB, urinary changes. Last BM was yesterday morning. She believes she is still passing flatus. She has a significant surgical history including perforated appendicitis resulting in ostomy and subsequent reversal as well as hysterectomy. She believes she has a history of similar in the past 5 years but doesn't recall needing an NGT. Work up in the ED concerning for mild leukocytosis to 14K (which is likely secondary to emesis and hemoconcentration), renal function was normal with sCr - 0.48, no electrolyte derangement, and hyperglycemic to 280. CT Abdomen/Pelvis was concerning for SBO. She was admitted to medicine service and refused NGT.   Surgery is consulted by emergency medicine physician Dr. Arta Silence, MD in this context for evaluation and management of SBO.   PAST MEDICAL HISTORY (PMH):  Past Medical History:  Diagnosis Date   Cancer ([place])    Diabetes mellitus without complication ([place])    Endometrial cancer ([place])    age 33     PAST SURGICAL HISTORY ([place]):  Past Surgical History:  Procedure Laterality Date   ABDOMINAL HYSTERECTOMY       MEDICATIONS:  Prior to Admission medications   Medication Sig Start Date End Date Taking? Authorizing Provider  insulin NPH-regular Human (70-30) 100 UNIT/ML injection Inject 30-40 Units into the skin daily with supper.   Yes [provider]  traMADol (ULTRAM) 50 MG tablet Take 1 tablet (50 mg total) by mouth  every 6 (six) hours as needed for severe pain. Patient not taking: Reported on 06/08/2020 11/24/18   Epifanio Lesches, MD     ALLERGIES:  Allergies  Allergen Reactions   Ciprofloxacin Shortness Of Breath and Nausea And Vomiting    Shortness of breath   Dexamethasone Sodium Phosphate Anaphylaxis, Rash and Swelling     SOCIAL HISTORY:  Social History   Socioeconomic History   Marital status: Married    Spouse name: Not on file   Number of children: Not on file   Years of education: Not on file   Highest education level: Not on file  Occupational History   Not on file  Tobacco Use   Smoking status: Current Every Day Smoker   Smokeless tobacco: Never Used  Substance and Sexual Activity   Alcohol use: Not Currently   Drug use: Not Currently   Sexual activity: Not on file  Other Topics Concern   Not on file  Social History Narrative   Not on file   Social Determinants of Health   Financial Resource Strain:    Difficulty of Paying Living Expenses:   Food Insecurity:    Worried About Charity fundraiser in the Last Year:    Arboriculturist in the Last Year:   Transportation Needs:    Film/video editor (Medical):    Lack of Transportation (Non-Medical):   Physical Activity:    Days of Exercise per Week:    Minutes of Exercise per Session:   Stress:    Feeling of Stress :   Social Connections:  Frequency of Communication with Friends and Family:    Frequency of Social Gatherings with Friends and Family:    Attends Religious Services:    Active Member of Clubs or Organizations:    Attends Music therapist:    Marital Status:   Intimate Partner Violence:    Fear of Current or Ex-Partner:    Emotionally Abused:    Physically Abused:    Sexually Abused:      FAMILY HISTORY:  Family History  Problem Relation Age of Onset   Endometrial cancer Mother       REVIEW OF SYSTEMS:  Review of Systems  Constitutional: Negative for chills and fever.   HENT: Negative for congestion and sore throat.   Respiratory: Negative for cough and shortness of breath.   Cardiovascular: Negative for chest pain and palpitations.  Gastrointestinal: Positive for abdominal pain, nausea and vomiting. Negative for constipation and diarrhea.  Genitourinary: Negative for dysuria and urgency.  All other systems reviewed and are negative.   VITAL SIGNS:  Temp:  [97.7 F (36.5 C)-98.1 F (36.7 C)] 98.1 F (36.7 C) (08/17 0603) Pulse Rate:  [68-98] 94 (08/17 0603) Resp:  [16-19] 16 (08/17 0603) BP: (93-120)/(62-83) 114/75 (08/17 0603) SpO2:  [93 %-100 %] 93 % (08/17 0603) Weight:  [72.3 kg] 72.3 kg (08/16 2307)     Height: 5\' 3"  (160 cm) Weight: 72.3 kg BMI (Calculated): 28.24   INTAKE/OUTPUT:  08/16 0701 - 08/17 0700 In: 0  Out: 300 [Urine:300]  PHYSICAL EXAM:  Physical Exam Vitals and nursing note reviewed. Exam conducted with a chaperone present.  Constitutional:      General: She is not in acute distress.    Appearance: She is well-developed. She is obese. She is not ill-appearing.     Comments: She is very tearful and anxious  HENT:     Head: Normocephalic and atraumatic.  Eyes:     General: No scleral icterus.    Extraocular Movements: Extraocular movements intact.  Cardiovascular:     Rate and Rhythm: Normal rate and regular rhythm.     Heart sounds: Normal heart sounds. No murmur heard.   Pulmonary:     Effort: Pulmonary effort is normal. No respiratory distress.     Breath sounds: Normal breath sounds.  Abdominal:     General: Abdomen is protuberant. A surgical scar is present. There is distension.     Palpations: Abdomen is soft.     Tenderness: There is abdominal tenderness in the epigastric area and left lower quadrant. There is no guarding or rebound.     Hernia: No hernia is present.     Comments: Abdomen is obese, soft, she is tender in the epigastrium and LLQ, mild distension, no peritonitis she has multiple surgical scars  present consistent with her surgical history   Genitourinary:    Comments: Deferred Skin:    General: Skin is warm and dry.     Coloration: Skin is not jaundiced or pale.  Neurological:     General: No focal deficit present.     Mental Status: She is alert and oriented to person, place, and time.  Psychiatric:        Attention and Perception: Attention normal.        Mood and Affect: Mood is anxious. Affect is tearful.      Labs:  CBC Latest Ref Rng & Units 06/09/2020 06/08/2020 11/24/2018  WBC 4.0 - 10.5 K/uL 12.9(H) 14.0(H) 8.2  Hemoglobin 12.0 - 15.0 g/dL 13.7  14.7 12.4  Hematocrit 36 - 46 % 38.8 40.7 35.3(L)  Platelets 150 - 400 K/uL 268 296 248   CMP Latest Ref Rng & Units 06/09/2020 06/08/2020 11/24/2018  Glucose 70 - 99 mg/dL 362(H) 284(H) 257(H)  BUN 6 - 20 mg/dL 7 8 9   Creatinine 0.44 - 1.00 mg/dL 0.59 0.48 0.59  Sodium 135 - 145 mmol/L 133(L) 135 135  Potassium 3.5 - 5.1 mmol/L 4.2 3.9 3.4(L)  Chloride 98 - 111 mmol/L 99 98 106  CO2 22 - 32 mmol/L 24 25 22   Calcium 8.9 - 10.3 mg/dL 8.5(L) 9.3 7.9(L)  Total Protein 6.5 - 8.1 g/dL - 7.5 -  Total Bilirubin 0.3 - 1.2 mg/dL - 0.7 -  Alkaline Phos 38 - 126 U/L - 106 -  AST 15 - 41 U/L - 27 -  ALT 0 - 44 U/L - 39 -     Imaging studies:   CT Abdomen/Pelvis (06/08/2020) personally reviewed showing multiple loops of dilated bowel with transition in upper pelvis, no pneumoperitoneum or pneumatosis, and radiologist report reviewed:  IMPRESSION: 1. Segmental dilatation of the proximal jejunum, with transition from dilated to nondilated jejunum within the central upper pelvis. Findings are consistent with small bowel obstruction. 2. Mild diffuse hepatic steatosis. 3. Diverticulosis without diverticulitis.   KUB (06/09/2020) personally reviewed with persistently dilated loops of small bowel, and radiologist report reviewed:  IMPRESSION: Multiple distended loops of small bowel again noted. Continued follow-up exams demonstrate  resolution suggested.    Assessment/Plan: (ICD-10's: K28.609) 39 y.o. 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   - Appreciate medicine admission  - Recommend NGT placement for decompression; LID; monitor and record output --> I have order prn Ativan for this as she is very anxious  - Recommend NPO + IVF resuscitation  - Monitor abdominal examination; on-going bowel function   - Pain control prn; antiemetics prn  - Monitor electrolytes prn   - No emergent surgical intervention; she understands if she fails conservative management she would require intervention however given her extensive surgical history she is at increased risk for complication from this and I would hope to avoid if possible  - mobilization encouraged   - Further management per primary team; we will follow   All of the above findings and recommendations were discussed with the patient and her family at bedside, and all of their questions were answered to their expressed satisfaction.  Thank you for the opportunity to participate in this patient's care.   -- Edison Simon, PA-C [facility] 06/09/2020, 8:49 AM [phone] M-F: 7am - 4pm  I saw and evaluated the patient.  I agree with the above documentation, exam, and plan, which I have edited where appropriate. Fredirick Maudlin  9:59 AM

## 2020-06-09 NOTE — Progress Notes (Signed)
PROGRESS NOTE    Erica Bush  POE:[id number] DOB: 10-17-1981 DOA: 06/08/2020 PCP: Patient, No Pcp Per   Chief complaint.  Abdominal pain.   Brief Narrative: Erica Bush  is a 39 y.o. Caucasian female with a known history of type 2 diabetes mellitus and endometrial cancer status post hysterectomy and status post appendectomy for ruptured appendix in the past and partial colon resection, who presented to the emergency room with acute onset of epigastric pain extending to the left lower quadrant with recurrent vomiting since this a.m. CT scan showed segmental dilation, small intestines consistent with small bowel obstruction.  Patient has been seen by general surgery, started IV fluids and symptomatic treatment.   Assessment & Plan:   Active Problems:   SBO (small bowel obstruction) ([place])  #1.  Small bowel obstruction. Most likely secondary to adhesion.  Patient has been seen by general surgery.  Currently n.p.o., continue IV fluids and symptomatic treatment.  2.  Uncontrolled type 2 diabetes with hyperglycemia. Patient currently does not have an insurance, does not have a PCP.  I have spoken with the social worker, will try to set up outpatient follow-up.  Patient will need prescription of NPH and regular insulin at time of discharge. Currently patient is covered with Lantus and sliding scale insulin.     DVT prophylaxis: Avelox Code Status: Full Family Communication: None Disposition Plan:  . Patient came from: Home            . Anticipated d/c place: Home . Barriers to d/c OR conditions which need to be met to effect a safe d/c:   Consultants:   General surgery.  Procedures: None Antimicrobials: None  Subjective: Patient still complaining of abdominal pain intermittently.  Nausea, vomiting.  Zofran with increase in dose. No fever chills. No short of breath or cough.  Objective: Vitals:   06/08/20 2017 06/08/20 2307 06/08/20 2316 06/09/20 0603  BP:   93/62 118/74 114/75  Pulse: 88 68 69 94  Resp:  19  16  Temp:  98 F (36.7 C)  98.1 F (36.7 C)  TempSrc:  Oral  Oral  SpO2: 96% 99% 99% 93%  Weight:  72.3 kg    Height:  '5\' 3"'  (1.6 m)      Intake/Output Summary (Last 24 hours) at 06/09/2020 1159 Last data filed at 06/09/2020 3154 Gross per 24 hour  Intake 0 ml  Output 300 ml  Net -300 ml   Filed Weights   06/08/20 2307  Weight: 72.3 kg    Examination:  General exam: Appears calm and comfortable  Respiratory system: Clear to auscultation. Respiratory effort normal. Cardiovascular system: S1 & S2 heard, RRR. No JVD, murmurs, rubs, gallops or clicks. No pedal edema. Gastrointestinal system: Abdomen is nondistended, soft and diffusely tender.  Increased bowel sounds. Central nervous system: Alert and oriented. No focal neurological deficits. Extremities: Symmetric  Skin: No rashes, lesions or ulcers Psychiatry:  Mood & affect appropriate.     Data Reviewed: I have personally reviewed following labs and imaging studies  CBC: Recent Labs  Lab 06/08/20 1616 06/09/20 0439  WBC 14.0* 12.9*  HGB 14.7 13.7  HCT 40.7 38.8  MCV 89.1 90.9  PLT 296 008   Basic Metabolic Panel: Recent Labs  Lab 06/08/20 1616 06/09/20 0439  NA 135 133*  K 3.9 4.2  CL 98 99  CO2 25 24  GLUCOSE 284* 362*  BUN 8 7  CREATININE 0.48 0.59  CALCIUM 9.3 8.5*  MG  --  2.0   GFR: Estimated Creatinine Clearance: 89.1 mL/min (by C-G formula based on SCr of 0.59 mg/dL). Liver Function Tests: Recent Labs  Lab 06/08/20 1616  AST 27  ALT 39  ALKPHOS 106  BILITOT 0.7  PROT 7.5  ALBUMIN 3.9   Recent Labs  Lab 06/08/20 1616  LIPASE 27   No results for input(s): AMMONIA in the last 168 hours. Coagulation Profile: No results for input(s): INR, PROTIME in the last 168 hours. Cardiac Enzymes: No results for input(s): CKTOTAL, CKMB, CKMBINDEX, TROPONINI in the last 168 hours. BNP (last 3 results) No results for input(s): PROBNP in the  last 8760 hours. HbA1C: Recent Labs    06/08/20 1616  HGBA1C 8.9*   CBG: Recent Labs  Lab 06/08/20 1615 06/08/20 2152 06/08/20 2314 06/09/20 0559  GLUCAP 255* 249* 282* 377*   Lipid Profile: No results for input(s): CHOL, HDL, LDLCALC, TRIG, CHOLHDL, LDLDIRECT in the last 72 hours. Thyroid Function Tests: No results for input(s): TSH, T4TOTAL, FREET4, T3FREE, THYROIDAB in the last 72 hours. Anemia Panel: No results for input(s): VITAMINB12, FOLATE, FERRITIN, TIBC, IRON, RETICCTPCT in the last 72 hours. Sepsis Labs: No results for input(s): PROCALCITON, LATICACIDVEN in the last 168 hours.  Recent Results (from the past 240 hour(s))  SARS Coronavirus 2 by RT PCR (hospital order, performed in [facility] hospital lab) Nasopharyngeal Nasopharyngeal Swab     Status: None   Collection Time: 06/08/20 10:33 PM   Specimen: Nasopharyngeal Swab  Result Value Ref Range Status   SARS Coronavirus 2 NEGATIVE NEGATIVE Final    Comment: (NOTE) SARS-CoV-2 target nucleic acids are NOT DETECTED.  The SARS-CoV-2 RNA is generally detectable in upper and lower respiratory specimens during the acute phase of infection. The lowest concentration of SARS-CoV-2 viral copies this assay can detect is 250 copies / mL. A negative result does not preclude SARS-CoV-2 infection and should not be used as the sole basis for treatment or other patient management decisions.  A negative result may occur with improper specimen collection / handling, submission of specimen other than nasopharyngeal swab, presence of viral mutation(s) within the areas targeted by this assay, and inadequate number of viral copies (<250 copies / mL). A negative result must be combined with clinical observations, patient history, and epidemiological information.  Fact Sheet for Patients:   StrictlyIdeas.no  Fact Sheet for Healthcare Providers: BankingDealers.co.za  This test is not  yet approved or  cleared by the [place] FDA and has been authorized for detection and/or diagnosis of SARS-CoV-2 by FDA under an Emergency Use Authorization (EUA).  This EUA will remain in effect (meaning this test can be used) for the duration of the COVID-19 declaration under Section 564(b)(1) of the Act, 21 U.S.C. section 360bbb-3(b)(1), unless the authorization is terminated or revoked sooner.  Performed at [facility], [address] [place], [place] [address]          Radiology Studies: CT ABDOMEN PELVIS W CONTRAST  Result Date: 06/08/2020 CLINICAL DATA:  Bilateral flank and upper abdominal pain EXAM: CT ABDOMEN AND PELVIS WITH CONTRAST TECHNIQUE: Multidetector CT imaging of the abdomen and pelvis was performed using the standard protocol following bolus administration of intravenous contrast. CONTRAST:  29m OMNIPAQUE IOHEXOL 350 MG/ML SOLN COMPARISON:  11/21/2018 FINDINGS: Lower chest: No acute pleural or parenchymal lung disease. Hepatobiliary: There is mild diffuse hepatic steatosis. No focal liver abnormality. The gallbladder is unremarkable. No biliary dilation. Pancreas: Unremarkable. No pancreatic ductal dilatation or surrounding inflammatory changes. Spleen: Normal  in size without focal abnormality. Adrenals/Urinary Tract: Adrenal glands are unremarkable. Kidneys are normal, without renal calculi, focal lesion, or hydronephrosis. Bladder is unremarkable. Stomach/Bowel: There is segmental dilatation of the proximal jejunum, measuring up to 3.4 cm in diameter. Transition from dilated to nondilated jejunum is seen within the central upper pelvis reference image 62/2. There is scattered diverticulosis of the distal colon without diverticulitis. Evidence of prior partial sigmoid colon resection and reanastomosis. The appendix is not identified and may be surgically absent. Vascular/Lymphatic: Surgical clips are seen within the retroperitoneum consistent with  lymphadenectomy. No pathologic adenopathy. No significant vascular findings are present. Reproductive: Status post hysterectomy. No adnexal masses. Other: No free fluid or free gas. No abdominal wall hernia. Postsurgical changes from prior laparotomy. Musculoskeletal: No acute or destructive bony lesions. Reconstructed images demonstrate no additional findings. IMPRESSION: 1. Segmental dilatation of the proximal jejunum, with transition from dilated to nondilated jejunum within the central upper pelvis. Findings are consistent with small bowel obstruction. 2. Mild diffuse hepatic steatosis. 3. Diverticulosis without diverticulitis. Electronically Signed   By: Randa Ngo M.D.   On: 06/08/2020 19:41   DG Abd 2 Views  Result Date: 06/09/2020 CLINICAL DATA:  Abdominal pain.  Flank pain. EXAM: ABDOMEN - 2 VIEW COMPARISON:  CT 06/08/2020. FINDINGS: Multiple distended loops of small bowel again noted. Colon is nondistended. Stool in the colon. No free air. No acute intra-abdominal abnormality identified. No acute bony abnormality. Surgical clips noted over the abdomen. Contrast noted in the bladder from prior CT. No acute bony abnormality. IMPRESSION: Multiple distended loops of small bowel again noted. Continued follow-up exams demonstrate resolution suggested. Electronically Signed   By: Marcello Moores  Register   On: 06/09/2020 08:10        Scheduled Meds: . enoxaparin (LOVENOX) injection  40 mg Subcutaneous Q24H  . insulin aspart  0-20 Units Subcutaneous Q4H  . insulin glargine  10 Units Subcutaneous Daily  . lidocaine  15 mL Mouth/Throat Once  . lidocaine-EPINEPHrine-tetracaine  3 mL Topical Once  . LORazepam  2 mg Intravenous Once   Continuous Infusions: . sodium chloride 100 mL/hr at 06/09/20 0640     LOS: 1 day    Time spent: 27 minutes    Sharen Hones, MD [facility]   To contact the attending provider between 7A-7P or the covering provider during after hours 7P-7A, please log  into the web site [email or web address] and access using universal [place] password for that web site. If you do not have the password, please call the hospital operator.  06/09/2020, 11:59 AM

## 2020-06-09 NOTE — Progress Notes (Signed)
Inpatient Diabetes Program Recommendations  AACE/ADA: New Consensus Statement on Inpatient Glycemic Control (2015)  Target Ranges:  Prepandial:   less than 140 mg/dL      Peak postprandial:   less than 180 mg/dL (1-2 hours)      Critically ill patients:  140 - 180 mg/dL   Lab Results  Component Value Date   GLUCAP 377 (H) 06/09/2020   HGBA1C 8.9 (H) 06/08/2020    Review of Glycemic Control Results for LUV, MISH (MRN [id number]) as of 06/09/2020 07:48  Ref. Range 06/08/2020 16:15 06/08/2020 21:52 06/08/2020 23:14 06/09/2020 05:59  Glucose-Capillary Latest Ref Range: 70 - 99 mg/dL 255 (H) 249 (H) 282 (H) 377 (H)   Diabetes history: DM 2 Outpatient Diabetes medications: Novolin 70/30 30-40 units q PM Current orders for Inpatient glycemic control:  Novolog resistant q 4 hours  Inpatient Diabetes Program Recommendations:    Please add basal insulin. Consider adding Levemir 10 units bid.   Thanks,  Adah Perl, RN, BC-ADM Inpatient Diabetes Coordinator Pager [phone] (8a-5p)

## 2020-06-09 NOTE — TOC Initial Note (Signed)
Transition of Care [facility]) - Initial/Assessment Note    Patient Details  Name: Erica Bush MRN: [id number] Date of Birth: 1981-02-16  [facility]) CM/SW Contact:    Candie Chroman, LCSW Phone Number: 06/09/2020, 4:22 PM  Clinical Narrative: CSW met with patient. Sister at bedside. CSW introduced role and inquired about patient not having a PCP. Patient very lethargic but nods appropriately. CSW provided packet for free and low-cost healthcare in [facility] as well as intake paperwork for [facility] if she decides to go there. Per MD, patient will need a glucometer kit at discharge. Will provide. No further concerns. CSW encouraged patient and her sister to contact CSW as needed. CSW will continue to follow patient for support and facilitate return home when stable.                 Expected Discharge Plan: Home/Self Care Barriers to Discharge: Continued Medical Work up   Patient Goals and CMS Choice        Expected Discharge Plan and Services Expected Discharge Plan: Home/Self Care       Living arrangements for the past 2 months: Single Family Home                                      Prior Living Arrangements/Services Living arrangements for the past 2 months: Single Family Home   Patient language and need for interpreter reviewed:: Yes Do you feel safe going back to the place where you live?: Yes      Need for Family Participation in Patient Care: Yes (Comment)     Criminal Activity/Legal Involvement Pertinent to Current Situation/Hospitalization: No - Comment as needed  Activities of Daily Living Home Assistive Devices/Equipment: None ADL Screening (condition at time of admission) Patient's cognitive ability adequate to safely complete daily activities?: No Is the patient deaf or have difficulty hearing?: No Does the patient have difficulty seeing, even when wearing glasses/contacts?: No Does the patient have difficulty  concentrating, remembering, or making decisions?: No Patient able to express need for assistance with ADLs?: Yes Does the patient have difficulty dressing or bathing?: No Independently performs ADLs?: Yes (appropriate for developmental age) Does the patient have difficulty walking or climbing stairs?: No Weakness of Legs: None Weakness of Arms/Hands: None  Permission Sought/Granted Permission sought to share information with : Family Supports    Share Information with NAME: Erica Bush     Permission granted to share info w Relationship: Sister  Permission granted to share info w Contact Information: [phone]  Emotional Assessment Appearance:: Appears stated age Attitude/Demeanor/Rapport: Lethargic   Orientation: : Oriented to Self, Oriented to Place, Oriented to  Time, Oriented to Situation Alcohol / Substance Use: Not Applicable Psych Involvement: No (comment)  Admission diagnosis:  Small bowel obstruction ([place]) [K56.609] SBO (small bowel obstruction) ([place]) [K56.609] Patient Active Problem List   Diagnosis Date Noted  . Uncontrolled type 2 diabetes mellitus with hyperglycemia ([place]) 06/09/2020  . SBO (small bowel obstruction) ([place]) 06/08/2020  . At high risk for breast cancer 09/13/2019  . Diverticulitis 11/21/2018   PCP:  Patient, No Pcp Per Pharmacy:   [facility] [address] [place] [address] Phone: [phone] Fax: [phone]     Social Determinants of Health (SDOH) Interventions    Readmission Risk Interventions No flowsheet data found.

## 2020-06-10 ENCOUNTER — Inpatient Hospital Stay: Payer: Self-pay

## 2020-06-10 LAB — CBC WITH DIFFERENTIAL/PLATELET
Abs Immature Granulocytes: 0.1 10*3/uL — ABNORMAL HIGH (ref 0.00–0.07)
Basophils Absolute: 0 10*3/uL (ref 0.0–0.1)
Basophils Relative: 0 %
Eosinophils Absolute: 0.3 10*3/uL (ref 0.0–0.5)
Eosinophils Relative: 3 %
HCT: 33.9 % — ABNORMAL LOW (ref 36.0–46.0)
Hemoglobin: 12.4 g/dL (ref 12.0–15.0)
Immature Granulocytes: 1 %
Lymphocytes Relative: 19 %
Lymphs Abs: 2 10*3/uL (ref 0.7–4.0)
MCH: 32.5 pg (ref 26.0–34.0)
MCHC: 36.6 g/dL — ABNORMAL HIGH (ref 30.0–36.0)
MCV: 88.7 fL (ref 80.0–100.0)
Monocytes Absolute: 0.6 10*3/uL (ref 0.1–1.0)
Monocytes Relative: 6 %
Neutro Abs: 7.6 10*3/uL (ref 1.7–7.7)
Neutrophils Relative %: 71 %
Platelets: 251 10*3/uL (ref 150–400)
RBC: 3.82 MIL/uL — ABNORMAL LOW (ref 3.87–5.11)
RDW: 12 % (ref 11.5–15.5)
WBC: 10.6 10*3/uL — ABNORMAL HIGH (ref 4.0–10.5)
nRBC: 0 % (ref 0.0–0.2)

## 2020-06-10 LAB — GLUCOSE, CAPILLARY
Glucose-Capillary: 204 mg/dL — ABNORMAL HIGH (ref 70–99)
Glucose-Capillary: 181 mg/dL — ABNORMAL HIGH (ref 70–99)
Glucose-Capillary: 154 mg/dL — ABNORMAL HIGH (ref 70–99)
Glucose-Capillary: 141 mg/dL — ABNORMAL HIGH (ref 70–99)
Glucose-Capillary: 113 mg/dL — ABNORMAL HIGH (ref 70–99)

## 2020-06-10 LAB — BASIC METABOLIC PANEL
Anion gap: 9 (ref 5–15)
BUN: 7 mg/dL (ref 6–20)
CO2: 25 mmol/L (ref 22–32)
Calcium: 8.2 mg/dL — ABNORMAL LOW (ref 8.9–10.3)
Chloride: 103 mmol/L (ref 98–111)
Creatinine, Ser: 0.63 mg/dL (ref 0.44–1.00)
GFR calc Af Amer: >60 mL/min (ref >60)
GFR calc non Af Amer: >60 mL/min (ref >60)
Glucose, Bld: 198 mg/dL — ABNORMAL HIGH (ref 70–99)
Potassium: 3 mmol/L — ABNORMAL LOW (ref 3.5–5.1)
Sodium: 137 mmol/L (ref 135–145)

## 2020-06-10 LAB — MAGNESIUM: Magnesium: 2 mg/dL (ref 1.7–2.4)

## 2020-06-10 MED ORDER — MORPHINE SULFATE (PF) 2 MG/ML IV SOLN
2.0000 mg | INTRAVENOUS | Status: DC | PRN
  Administered 2020-06-10 – 2020-06-11 (×4): 2 mg via INTRAVENOUS
  Filled 2020-06-10 (×4): qty 1

## 2020-06-10 MED ORDER — PHENOL 1.4 % MT LIQD
2.0000 | OROMUCOSAL | Status: DC | PRN
  Administered 2020-06-10: 2 via OROMUCOSAL
  Filled 2020-06-10 (×2): qty 177

## 2020-06-10 MED ORDER — POTASSIUM CHLORIDE 10 MEQ/100ML IV SOLN
10.0000 meq | INTRAVENOUS | Status: AC
  Administered 2020-06-10 (×4): 10 meq via INTRAVENOUS
  Filled 2020-06-10 (×4): qty 100

## 2020-06-10 MED ORDER — POTASSIUM CHLORIDE CRYS ER 20 MEQ PO TBCR
40.0000 meq | EXTENDED_RELEASE_TABLET | ORAL | Status: AC
  Filled 2020-06-10: qty 2

## 2020-06-10 NOTE — Progress Notes (Signed)
PROGRESS NOTE    Erica Bush  WCB:[id number] DOB: 07-Nov-1980 DOA: 06/08/2020 PCP: Patient, No Pcp Per   Chief complaint.  Abdominal pain.   Brief Narrative: Erica Bush  is a 39 y.o. Caucasian female with a known history of type 2 diabetes mellitus and endometrial cancer status post hysterectomy and status post appendectomy for ruptured appendix in the past and partial colon resection, who presented to the emergency room with acute onset of epigastric pain extending to the left lower quadrant with recurrent vomiting since this a.m. CT scan showed segmental dilation, small intestines consistent with small bowel obstruction.  Patient has been seen by general surgery, started IV fluids and symptomatic treatment.   Assessment & Plan:   Active Problems:   SBO (small bowel obstruction) ([place])   Uncontrolled type 2 diabetes mellitus with hyperglycemia ([place])  #1.  Small bowel obstruction. Most likely secondary to adhesion.  Patient has been seen by general surgery.  --continue NG tube --NPO --continue MIVF@100  while NPO  2.  Uncontrolled type 2 diabetes with hyperglycemia. Patient currently does not have an insurance, does not have a PCP.  I have spoken with the social worker, will try to set up outpatient follow-up.  Patient will need prescription of NPH and regular insulin at time of discharge. Currently patient is covered with Lantus and sliding scale insulin.   DVT prophylaxis: Lovenox SQ Code Status: Full code  Family Communication:  Status is: inpatient Dispo:   The patient is from: home Anticipated d/c is to: home Anticipated d/c date is: 2-3 days Patient currently is not medically stable to d/c due to: still on NG tube suctioning.  NPO.    Consultants:   General surgery.  Procedures: None Antimicrobials: None  Subjective: Pt pulled out her NG tube, which was later re-inserted by GenSurg.  Complained of nausea and abdominal  pain.   Objective: Vitals:   06/09/20 1958 06/10/20 0458 06/10/20 1140 06/10/20 1921  BP: 130/76 110/69 105/65 115/70  Pulse: (!) 106 92 94 96  Resp: 18 18  18   Temp: 99 F (37.2 C) 98.5 F (36.9 C) 98.9 F (37.2 C) 98.2 F (36.8 C)  TempSrc: Oral Oral Oral   SpO2: 94% 94% 90% 98%  Weight:      Height:        Intake/Output Summary (Last 24 hours) at 06/10/2020 1934 Last data filed at 06/10/2020 0545 Gross per 24 hour  Intake 2860.57 ml  Output 600 ml  Net 2260.57 ml   Filed Weights   06/08/20 2307  Weight: 72.3 kg    Examination:  Constitutional: NAD, AAOx3 HEENT: conjunctivae and lids normal, EOMI CV: RRR no M,R,G. Distal pulses +2.  No cyanosis.   RESP: CTA B/L, normal respiratory effort  GI:  No BS, ND, NG tube outputting dark green liquid Extremities: No effusions, edema, or tenderness in BLE SKIN: warm, dry and intact Neuro: II - XII grossly intact.  Sensation intact    Data Reviewed: I have personally reviewed following labs and imaging studies  CBC: Recent Labs  Lab 06/08/20 1616 06/09/20 0439 06/10/20 0451  WBC 14.0* 12.9* 10.6*  NEUTROABS  --   --  7.6  HGB 14.7 13.7 12.4  HCT 40.7 38.8 33.9*  MCV 89.1 90.9 88.7  PLT 296 268 616   Basic Metabolic Panel: Recent Labs  Lab 06/08/20 1616 06/09/20 0439 06/10/20 0451  NA 135 133* 137  K 3.9 4.2 3.0*  CL 98 99 103  CO2 25 24  25  GLUCOSE 284* 362* 198*  BUN 8 7 7   CREATININE 0.48 0.59 0.63  CALCIUM 9.3 8.5* 8.2*  MG  --  2.0 2.0   GFR: Estimated Creatinine Clearance: 89.1 mL/min (by C-G formula based on SCr of 0.63 mg/dL). Liver Function Tests: Recent Labs  Lab 06/08/20 1616  AST 27  ALT 39  ALKPHOS 106  BILITOT 0.7  PROT 7.5  ALBUMIN 3.9   Recent Labs  Lab 06/08/20 1616  LIPASE 27   No results for input(s): AMMONIA in the last 168 hours. Coagulation Profile: No results for input(s): INR, PROTIME in the last 168 hours. Cardiac Enzymes: No results for input(s): CKTOTAL,  CKMB, CKMBINDEX, TROPONINI in the last 168 hours. BNP (last 3 results) No results for input(s): PROBNP in the last 8760 hours. HbA1C: Recent Labs    06/08/20 1616  HGBA1C 8.9*   CBG: Recent Labs  Lab 06/09/20 2336 06/10/20 0454 06/10/20 0807 06/10/20 1138 06/10/20 1704  GLUCAP 157* 204* 181* 154* 141*   Lipid Profile: No results for input(s): CHOL, HDL, LDLCALC, TRIG, CHOLHDL, LDLDIRECT in the last 72 hours. Thyroid Function Tests: No results for input(s): TSH, T4TOTAL, FREET4, T3FREE, THYROIDAB in the last 72 hours. Anemia Panel: No results for input(s): VITAMINB12, FOLATE, FERRITIN, TIBC, IRON, RETICCTPCT in the last 72 hours. Sepsis Labs: No results for input(s): PROCALCITON, LATICACIDVEN in the last 168 hours.  Recent Results (from the past 240 hour(s))  SARS Coronavirus 2 by RT PCR (hospital order, performed in [facility] hospital lab) Nasopharyngeal Nasopharyngeal Swab     Status: None   Collection Time: 06/08/20 10:33 PM   Specimen: Nasopharyngeal Swab  Result Value Ref Range Status   SARS Coronavirus 2 NEGATIVE NEGATIVE Final    Comment: (NOTE) SARS-CoV-2 target nucleic acids are NOT DETECTED.  The SARS-CoV-2 RNA is generally detectable in upper and lower respiratory specimens during the acute phase of infection. The lowest concentration of SARS-CoV-2 viral copies this assay can detect is 250 copies / mL. A negative result does not preclude SARS-CoV-2 infection and should not be used as the sole basis for treatment or other patient management decisions.  A negative result may occur with improper specimen collection / handling, submission of specimen other than nasopharyngeal swab, presence of viral mutation(s) within the areas targeted by this assay, and inadequate number of viral copies (<250 copies / mL). A negative result must be combined with clinical observations, patient history, and epidemiological information.  Fact Sheet for Patients:    StrictlyIdeas.no  Fact Sheet for Healthcare Providers: BankingDealers.co.za  This test is not yet approved or  cleared by the [place] FDA and has been authorized for detection and/or diagnosis of SARS-CoV-2 by FDA under an Emergency Use Authorization (EUA).  This EUA will remain in effect (meaning this test can be used) for the duration of the COVID-19 declaration under Section 564(b)(1) of the Act, 21 U.S.C. section 360bbb-3(b)(1), unless the authorization is terminated or revoked sooner.  Performed at [facility], [address], [place], [place] [address]          Radiology Studies: DG Abd 1 View  Result Date: 06/10/2020 CLINICAL DATA:  Nasogastric tube placement EXAM: ABDOMEN - 1 VIEW COMPARISON:  June 09, 2020. FINDINGS: Nasogastric tube tip is at the approximate level of the pylorus. No bowel dilatation or air-fluid level evident on semi-erect image. No free air. There is atelectatic change in the left lung base. Lung bases otherwise are clear. IMPRESSION: Nasogastric tube tip  at level of gastric pylorus. No bowel obstruction or free air. Left base atelectasis. Electronically Signed   By: Lowella Grip III M.D.   On: 06/10/2020 12:17   DG Abd 1 View  Result Date: 06/09/2020 CLINICAL DATA:  Status post nasogastric tube placement. EXAM: ABDOMEN - 1 VIEW COMPARISON:  June 09, 2020 (7:44 a.m.) FINDINGS: A nasogastric tube is seen with its distal tip overlying the body of the stomach. Multiple dilated small bowel loops are seen overlying the left abdomen. These are stable in caliber when compared to the prior study. Radiopaque surgical clips are seen overlying the lower abdomen, along the midline. No radio-opaque calculi or other significant radiographic abnormality are seen. IMPRESSION: Nasogastric tube positioning, as described above. Electronically Signed   By: Virgina Norfolk M.D.   On: 06/09/2020 15:08    DG Abd 2 Views  Result Date: 06/09/2020 CLINICAL DATA:  Abdominal pain.  Flank pain. EXAM: ABDOMEN - 2 VIEW COMPARISON:  CT 06/08/2020. FINDINGS: Multiple distended loops of small bowel again noted. Colon is nondistended. Stool in the colon. No free air. No acute intra-abdominal abnormality identified. No acute bony abnormality. Surgical clips noted over the abdomen. Contrast noted in the bladder from prior CT. No acute bony abnormality. IMPRESSION: Multiple distended loops of small bowel again noted. Continued follow-up exams demonstrate resolution suggested. Electronically Signed   By: Marcello Moores  Register   On: 06/09/2020 08:10        Scheduled Meds: . enoxaparin (LOVENOX) injection  40 mg Subcutaneous Q24H  . insulin aspart  0-20 Units Subcutaneous Q4H  . insulin glargine  10 Units Subcutaneous Daily   Continuous Infusions: . sodium chloride 100 mL/hr at 06/10/20 1124     LOS: 2 days    Enzo Bi, MD [facility]   To contact the attending provider between 7A-7P or the covering provider during after hours 7P-7A, please log into the web site [email or web address] and access using universal [place] password for that web site. If you do not have the password, please call the hospital operator.  06/10/2020, 7:34 PM

## 2020-06-10 NOTE — Progress Notes (Signed)
Patient reports NG tube "fell out" and refuses to allow it to be replaced tonight, Informed Rufina Falco, NP on call Hospitalist.

## 2020-06-10 NOTE — Progress Notes (Signed)
Notified NP that pt removed NG tube. Pt is A&Ox4, and refusing new one. Will continue to monitor.

## 2020-06-10 NOTE — Progress Notes (Addendum)
[facility] SURGICAL PROGRESS NOTE (cpt [id number])  Hospital Day(s): 2.   Interval History: Patient seen and examined, no acute events or new complaints overnight. Patient reports she continues to have abdominal soreness, worse in LLQ, which is unchanged. She also endorses nausea and frequent hiccups. She denies fever, chills, emesis. She does continue to pass flatus reportedly. Leukocytosis continues to improve, now 10.6K, likely secondary to emesis and dehydration. Renal function remain normal with sCr - 0.63, good UO. Mild hypokalemia this morning to 3.0. She had an NGT placed yesterday afternoon which had 600 ccs out, however, patient removed this overnight secondary to it being uncomfortable. Not been mobilizing.  Review of Systems:  Constitutional: denies fever, chills  HEENT: denies cough or congestion  Respiratory: denies any shortness of breath  Cardiovascular: denies chest pain or palpitations  Gastrointestinal: + abdominal pain, + nausea, denied vomiting, or diarrhea/and bowel function as per interval history Genitourinary: denies burning with urination or urinary frequency  Vital signs in last 24 hours: [min-max] current  Temp:  [98.5 F (36.9 C)-99 F (37.2 C)] 98.5 F (36.9 C) (08/18 0458) Pulse Rate:  [92-106] 92 (08/18 0458) Resp:  [18] 18 (08/18 0458) BP: (110-130)/(69-81) 110/69 (08/18 0458) SpO2:  [89 %-94 %] 94 % (08/18 0458)     Height: 5\' 3"  (160 cm) Weight: 72.3 kg BMI (Calculated): 28.24   Intake/Output last 2 shifts:  08/17 0701 - 08/18 0700 In: 2860.6 [I.V.:2860.6] Out: 600 [Emesis/NG output:600]   Physical Exam:  Constitutional: alert, cooperative and no distress  HENT: normocephalic without obvious abnormality  Respiratory: breathing non-labored at rest  Cardiovascular: regular rate and sinus rhythm  Gastrointestinal: Soft, LLQ soreness, and non-distended, no rebound/guarding. Multiple surgical scars present consistent with previous  surgeries.  Musculoskeletal: no edema or wounds, motor and sensation grossly intact, NT    Labs:  CBC Latest Ref Rng & Units 06/10/2020 06/09/2020 06/08/2020  WBC 4.0 - 10.5 K/uL 10.6(H) 12.9(H) 14.0(H)  Hemoglobin 12.0 - 15.0 g/dL 12.4 13.7 14.7  Hematocrit 36 - 46 % 33.9(L) 38.8 40.7  Platelets 150 - 400 K/uL 251 268 296   CMP Latest Ref Rng & Units 06/10/2020 06/09/2020 06/08/2020  Glucose 70 - 99 mg/dL 198(H) 362(H) 284(H)  BUN 6 - 20 mg/dL 7 7 8   Creatinine 0.44 - 1.00 mg/dL 0.63 0.59 0.48  Sodium 135 - 145 mmol/L 137 133(L) 135  Potassium 3.5 - 5.1 mmol/L 3.0(L) 4.2 3.9  Chloride 98 - 111 mmol/L 103 99 98  CO2 22 - 32 mmol/L 25 24 25   Calcium 8.9 - 10.3 mg/dL 8.2(L) 8.5(L) 9.3  Total Protein 6.5 - 8.1 g/dL - - 7.5  Total Bilirubin 0.3 - 1.2 mg/dL - - 0.7  Alkaline Phos 38 - 126 U/L - - 106  AST 15 - 41 U/L - - 27  ALT 0 - 44 U/L - - 39     Imaging studies: No new pertinent imaging studies   Assessment/Plan: (ICD-10's: K73.609) 39 y.o. 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 Patient advised of the importance of leaving the NGT in place, despite discomfort.  - I will repeat KUB  - Recommend NPO + IVF resuscitation             - Monitor abdominal examination; on-going bowel function              -  Pain control prn; antiemetics prn             - Monitor electrolytes prn              - No emergent surgical intervention; she understands if she fails conservative management she would require intervention however given her extensive surgical history she is at increased risk for complication from this and I would hope to avoid if possible             - mobilization encouraged              - Further management per primary team; we will follow   All of the above findings  and recommendations were discussed with the patient, patient's family at bedside, and the medical team, and all of their questions were answered to their expressed satisfaction.  -- Edison Simon, PA-C  [facility] 06/10/2020, 7:30 AM [phone] M-F: 7am - 4pm  I saw and evaluated the patient.  I agree with the above documentation, exam, and plan, which I have edited where appropriate. Fredirick Maudlin  11:57 AM

## 2020-06-11 LAB — CBC
HCT: 33.7 % — ABNORMAL LOW (ref 36.0–46.0)
Hemoglobin: 11.8 g/dL — ABNORMAL LOW (ref 12.0–15.0)
MCH: 32.1 pg (ref 26.0–34.0)
MCHC: 35 g/dL (ref 30.0–36.0)
MCV: 91.6 fL (ref 80.0–100.0)
Platelets: 233 10*3/uL (ref 150–400)
RBC: 3.68 MIL/uL — ABNORMAL LOW (ref 3.87–5.11)
RDW: 11.9 % (ref 11.5–15.5)
WBC: 9.5 10*3/uL (ref 4.0–10.5)
nRBC: 0 % (ref 0.0–0.2)

## 2020-06-11 LAB — BASIC METABOLIC PANEL
Anion gap: 8 (ref 5–15)
BUN: 6 mg/dL (ref 6–20)
CO2: 26 mmol/L (ref 22–32)
Calcium: 8 mg/dL — ABNORMAL LOW (ref 8.9–10.3)
Chloride: 106 mmol/L (ref 98–111)
Creatinine, Ser: 0.64 mg/dL (ref 0.44–1.00)
GFR calc Af Amer: >60 mL/min (ref >60)
GFR calc non Af Amer: >60 mL/min (ref >60)
Glucose, Bld: 141 mg/dL — ABNORMAL HIGH (ref 70–99)
Potassium: 3.4 mmol/L — ABNORMAL LOW (ref 3.5–5.1)
Sodium: 140 mmol/L (ref 135–145)

## 2020-06-11 LAB — MAGNESIUM: Magnesium: 2.2 mg/dL (ref 1.7–2.4)

## 2020-06-11 LAB — GLUCOSE, CAPILLARY
Glucose-Capillary: 103 mg/dL — ABNORMAL HIGH (ref 70–99)
Glucose-Capillary: 128 mg/dL — ABNORMAL HIGH (ref 70–99)
Glucose-Capillary: 145 mg/dL — ABNORMAL HIGH (ref 70–99)
Glucose-Capillary: 152 mg/dL — ABNORMAL HIGH (ref 70–99)
Glucose-Capillary: 153 mg/dL — ABNORMAL HIGH (ref 70–99)
Glucose-Capillary: 96 mg/dL (ref 70–99)

## 2020-06-11 MED ORDER — POTASSIUM CHLORIDE 10 MEQ/100ML IV SOLN
10.0000 meq | INTRAVENOUS | Status: AC
  Administered 2020-06-11 (×4): 10 meq via INTRAVENOUS
  Filled 2020-06-11 (×4): qty 100

## 2020-06-11 MED ORDER — MORPHINE SULFATE (PF) 2 MG/ML IV SOLN
2.0000 mg | INTRAVENOUS | Status: DC | PRN
  Filled 2020-06-11: qty 1

## 2020-06-11 MED ORDER — POTASSIUM CHLORIDE 10 MEQ/100ML IV SOLN
10.0000 meq | Freq: Once | INTRAVENOUS | Status: AC
  Administered 2020-06-11: 10 meq via INTRAVENOUS
  Filled 2020-06-11: qty 100

## 2020-06-11 MED ORDER — MORPHINE SULFATE 15 MG PO TABS
15.0000 mg | ORAL_TABLET | ORAL | Status: DC | PRN
  Administered 2020-06-11 (×2): 15 mg via ORAL
  Filled 2020-06-11 (×4): qty 1

## 2020-06-11 NOTE — Progress Notes (Addendum)
[facility] SURGICAL PROGRESS NOTE (cpt [phone])  Hospital Day(s): 3.   Interval History: Patient seen and examined, no acute events or new complaints overnight. Patient reports she continues to have abdominal discomfort primarily in her upper abdomen but her LLQ discomfort has improved. She denied any overt nausea or emesis but states sometimes she feels like she has to "spit up." No fever or chills. No leukocytosis on CBC this morning. Renal function is normal with sCr - 0.64. Hypokalemia improving to 3.4 this morning and no other appreciable electrolyte derangements. Despite NGT being placed again yesterday morning this "fell out" reportedly overnight according to the patient. No output recorded but it appears she likely had about 700 ccs out before the NGT came out. She does continue to endorse flatus.   Review of Systems:  Constitutional: denies fever, chills  HEENT: denies cough or congestion  Respiratory: denies any shortness of breath  Cardiovascular: denies chest pain or palpitations  Gastrointestinal: + abdominal pain, denied N/V, or diarrhea/and bowel function as per interval history Genitourinary: denies burning with urination or urinary frequency   Vital signs in last 24 hours: [min-max] current  Temp:  [98.2 F (36.8 C)-99.9 F (37.7 C)] 99.9 F (37.7 C) (08/19 0516) Pulse Rate:  [89-96] 89 (08/19 0516) Resp:  [18] 18 (08/19 0516) BP: (105-117)/(65-78) 117/78 (08/19 0516) SpO2:  [90 %-98 %] 93 % (08/19 0516)     Height: 5\' 3"  (160 cm) Weight: 72.3 kg BMI (Calculated): 28.24   Intake/Output last 2 shifts:  08/18 0701 - 08/19 0700 In: -  Out: 500 [Urine:500]   Physical Exam:  Constitutional: alert, cooperative and no distress  HENT: normocephalic without obvious abnormality  Respiratory: breathing non-labored at rest  Cardiovascular: regular rate and sinus rhythm  Gastrointestinal: Soft, LLQ soreness improved, primarily complaining of discomfort in  epigastrium, and non-distended, no rebound/guarding. Multiple surgical scars present consistent with previous surgeries.  Musculoskeletal: no edema or wounds, motor and sensation grossly intact, NT  Psychiatric: She is extremely anxious and becomes immediately tearful when I discuss treatment plan and NGT  Labs:  CBC Latest Ref Rng & Units 06/11/2020 06/10/2020 06/09/2020  WBC 4.0 - 10.5 K/uL 9.5 10.6(H) 12.9(H)  Hemoglobin 12.0 - 15.0 g/dL 11.8(L) 12.4 13.7  Hematocrit 36 - 46 % 33.7(L) 33.9(L) 38.8  Platelets 150 - 400 K/uL 233 251 268   CMP Latest Ref Rng & Units 06/11/2020 06/10/2020 06/09/2020  Glucose 70 - 99 mg/dL 141(H) 198(H) 362(H)  BUN 6 - 20 mg/dL 6 7 7   Creatinine 0.44 - 1.00 mg/dL 0.64 0.63 0.59  Sodium 135 - 145 mmol/L 140 137 133(L)  Potassium 3.5 - 5.1 mmol/L 3.4(L) 3.0(L) 4.2  Chloride 98 - 111 mmol/L 106 103 99  CO2 22 - 32 mmol/L 26 25 24   Calcium 8.9 - 10.3 mg/dL 8.0(L) 8.2(L) 8.5(L)  Total Protein 6.5 - 8.1 g/dL - - -  Total Bilirubin 0.3 - 1.2 mg/dL - - -  Alkaline Phos 38 - 126 U/L - - -  AST 15 - 41 U/L - - -  ALT 0 - 44 U/L - - -     Imaging studies: No new pertinent imaging studies   Assessment/Plan: (ICD-10's: K3.609) 39 y.o. 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 I secured this in multiple points yesterday so I  doubt this "fell out" last night. I will compromise with her today as her pain is improved some and she is passing flatus. She understands that NGT decompression is the standard of care for her condition and we will likely make no progress without this. If she develops worsening pain, nausea, or emesis, we will place this.   - Continue NPO + IVF resuscitation  - Monitor abdominal examination;  on-going bowel function  - Pain control prn; antiemetics prn - Monitor electrolytes prn; replete K+ - No emergent surgical intervention; she understands if she fails conservative management she would require intervention however given her extensive surgical history she is at increased risk for complication from this and I would hope to avoid if possible - mobilization encouraged; she needs to ambulate in halls -Further management per primary team; we will follow  All of the above findings and recommendations were discussed with the patient, patient's family at bedside, and the medical team, and all of their questions were answered to their expressed satisfaction.  -- Edison Simon, PA-C [facility] 06/11/2020, 7:47 AM [phone] M-F: 7am - 4pm

## 2020-06-11 NOTE — Progress Notes (Signed)
PROGRESS NOTE    Erica Bush  FIE:[id number] DOB: July 19, 1981 DOA: 06/08/2020 PCP: Patient, No Pcp Per   Chief complaint.  Abdominal pain.   Brief Narrative: Erica Bush  is a 39 y.o. Caucasian female with a known history of type 2 diabetes mellitus and endometrial cancer status post hysterectomy and status post appendectomy for ruptured appendix in the past and partial colon resection, who presented to the emergency room with acute onset of epigastric pain extending to the left lower quadrant with recurrent vomiting since this a.m. CT scan showed segmental dilation, small intestines consistent with small bowel obstruction.  Patient has been seen by general surgery, started IV fluids and symptomatic treatment.   Assessment & Plan:   Active Problems:   SBO (small bowel obstruction) ([place])   Uncontrolled type 2 diabetes mellitus with hyperglycemia ([place])  #1.  Small bowel obstruction. Most likely secondary to adhesion.  Patient has been seen by general surgery.  --NG tube pulled off twice already PLAN: --Hold off NG tube re-insertion and monitor for N/V --NPO for now except for meds, per GenSurg --continue MIVF@100  while NPO --PO morphine for pain (avoid IV morphine due to soft BP)  2.  Uncontrolled type 2 diabetes with hyperglycemia. Patient currently does not have an insurance, does not have a PCP.  I have spoken with the social worker, will try to set up outpatient follow-up.  Patient will need prescription of NPH and regular insulin at time of discharge. Currently patient is covered with Lantus and sliding scale insulin.   DVT prophylaxis: Lovenox SQ Code Status: Full code  Family Communication:  Status is: inpatient Dispo:   The patient is from: home Anticipated d/c is to: home Anticipated d/c date is: 2-3 days Patient currently is not medically stable to d/c due to: still NPO, awaiting GenSurg clearance to advance diet.    Consultants:   General  surgery.  Procedures: None Antimicrobials: None  Subjective: Pt reported accidentally pulled out her NG tube again while sneezing and cleaning her nose.  Continued to have nausea and abdominal pain, but improved from prior.   Objective: Vitals:   06/11/20 0516 06/11/20 1100 06/11/20 1202 06/11/20 1206  BP: 117/78 99/75 96/64  95/60  Pulse: 89 80    Resp: 18 14    Temp: 99.9 F (37.7 C)     TempSrc: Oral     SpO2: 93%     Weight:      Height:        Intake/Output Summary (Last 24 hours) at 06/11/2020 1841 Last data filed at 06/11/2020 1500 Gross per 24 hour  Intake 0 ml  Output 500 ml  Net -500 ml   Filed Weights   06/08/20 2307  Weight: 72.3 kg    Examination:  Constitutional: NAD, AAOx3 HEENT: conjunctivae and lids normal, EOMI CV: RRR no M,R,G. Distal pulses +2.  No cyanosis.   RESP: CTA B/L, normal respiratory effort  GI:  No BS, ND, soft Extremities: No effusions, edema, or tenderness in BLE SKIN: warm, dry and intact Neuro: II - XII grossly intact.  Sensation intact   Data Reviewed: I have personally reviewed following labs and imaging studies  CBC: Recent Labs  Lab 06/08/20 1616 06/09/20 0439 06/10/20 0451 06/11/20 0413  WBC 14.0* 12.9* 10.6* 9.5  NEUTROABS  --   --  7.6  --   HGB 14.7 13.7 12.4 11.8*  HCT 40.7 38.8 33.9* 33.7*  MCV 89.1 90.9 88.7 91.6  PLT 296 268 251 233  Basic Metabolic Panel: Recent Labs  Lab 06/08/20 1616 06/09/20 0439 06/10/20 0451 06/11/20 0413  NA 135 133* 137 140  K 3.9 4.2 3.0* 3.4*  CL 98 99 103 106  CO2 25 24 25 26   GLUCOSE 284* 362* 198* 141*  BUN 8 7 7 6   CREATININE 0.48 0.59 0.63 0.64  CALCIUM 9.3 8.5* 8.2* 8.0*  MG  --  2.0 2.0 2.2   GFR: Estimated Creatinine Clearance: 89.1 mL/min (by C-G formula based on SCr of 0.64 mg/dL). Liver Function Tests: Recent Labs  Lab 06/08/20 1616  AST 27  ALT 39  ALKPHOS 106  BILITOT 0.7  PROT 7.5  ALBUMIN 3.9   Recent Labs  Lab 06/08/20 1616  LIPASE 27    No results for input(s): AMMONIA in the last 168 hours. Coagulation Profile: No results for input(s): INR, PROTIME in the last 168 hours. Cardiac Enzymes: No results for input(s): CKTOTAL, CKMB, CKMBINDEX, TROPONINI in the last 168 hours. BNP (last 3 results) No results for input(s): PROBNP in the last 8760 hours. HbA1C: No results for input(s): HGBA1C in the last 72 hours. CBG: Recent Labs  Lab 06/11/20 0005 06/11/20 0517 06/11/20 0802 06/11/20 1147 06/11/20 1637  GLUCAP 152* 145* 153* 128* 103*   Lipid Profile: No results for input(s): CHOL, HDL, LDLCALC, TRIG, CHOLHDL, LDLDIRECT in the last 72 hours. Thyroid Function Tests: No results for input(s): TSH, T4TOTAL, FREET4, T3FREE, THYROIDAB in the last 72 hours. Anemia Panel: No results for input(s): VITAMINB12, FOLATE, FERRITIN, TIBC, IRON, RETICCTPCT in the last 72 hours. Sepsis Labs: No results for input(s): PROCALCITON, LATICACIDVEN in the last 168 hours.  Recent Results (from the past 240 hour(s))  SARS Coronavirus 2 by RT PCR (hospital order, performed in [facility] hospital lab) Nasopharyngeal Nasopharyngeal Swab     Status: None   Collection Time: 06/08/20 10:33 PM   Specimen: Nasopharyngeal Swab  Result Value Ref Range Status   SARS Coronavirus 2 NEGATIVE NEGATIVE Final    Comment: (NOTE) SARS-CoV-2 target nucleic acids are NOT DETECTED.  The SARS-CoV-2 RNA is generally detectable in upper and lower respiratory specimens during the acute phase of infection. The lowest concentration of SARS-CoV-2 viral copies this assay can detect is 250 copies / mL. A negative result does not preclude SARS-CoV-2 infection and should not be used as the sole basis for treatment or other patient management decisions.  A negative result may occur with improper specimen collection / handling, submission of specimen other than nasopharyngeal swab, presence of viral mutation(s) within the areas targeted by this assay, and  inadequate number of viral copies (<250 copies / mL). A negative result must be combined with clinical observations, patient history, and epidemiological information.  Fact Sheet for Patients:   StrictlyIdeas.no  Fact Sheet for Healthcare Providers: BankingDealers.co.za  This test is not yet approved or  cleared by the [place] FDA and has been authorized for detection and/or diagnosis of SARS-CoV-2 by FDA under an Emergency Use Authorization (EUA).  This EUA will remain in effect (meaning this test can be used) for the duration of the COVID-19 declaration under Section 564(b)(1) of the Act, 21 U.S.C. section 360bbb-3(b)(1), unless the authorization is terminated or revoked sooner.  Performed at [facility], [address] [place], [place] [address]          Radiology Studies: DG Abd 1 View  Result Date: 06/10/2020 CLINICAL DATA:  Nasogastric tube placement EXAM: ABDOMEN - 1 VIEW COMPARISON:  June 09, 2020. FINDINGS: Nasogastric  tube tip is at the approximate level of the pylorus. No bowel dilatation or air-fluid level evident on semi-erect image. No free air. There is atelectatic change in the left lung base. Lung bases otherwise are clear. IMPRESSION: Nasogastric tube tip at level of gastric pylorus. No bowel obstruction or free air. Left base atelectasis. Electronically Signed   By: Lowella Grip III M.D.   On: 06/10/2020 12:17        Scheduled Meds: . enoxaparin (LOVENOX) injection  40 mg Subcutaneous Q24H  . insulin aspart  0-20 Units Subcutaneous Q4H  . insulin glargine  10 Units Subcutaneous Daily   Continuous Infusions: . sodium chloride 100 mL/hr at 06/11/20 1039     LOS: 3 days    Enzo Bi, MD [facility]   To contact the attending provider between 7A-7P or the covering provider during after hours 7P-7A, please log into the web site [email or web address] and access using universal [facility] password for that web site. If you do not have the password, please call the hospital operator.  06/11/2020, 6:41 PM

## 2020-06-12 ENCOUNTER — Inpatient Hospital Stay: Payer: Self-pay

## 2020-06-12 LAB — CBC
HCT: 34 % — ABNORMAL LOW (ref 36.0–46.0)
Hemoglobin: 11.9 g/dL — ABNORMAL LOW (ref 12.0–15.0)
MCH: 32 pg (ref 26.0–34.0)
MCHC: 35 g/dL (ref 30.0–36.0)
MCV: 91.4 fL (ref 80.0–100.0)
Platelets: 233 10*3/uL (ref 150–400)
RBC: 3.72 MIL/uL — ABNORMAL LOW (ref 3.87–5.11)
RDW: 11.9 % (ref 11.5–15.5)
WBC: 9.8 10*3/uL (ref 4.0–10.5)
nRBC: 0.3 % — ABNORMAL HIGH (ref 0.0–0.2)

## 2020-06-12 LAB — BASIC METABOLIC PANEL
Anion gap: 6 (ref 5–15)
BUN: 6 mg/dL (ref 6–20)
CO2: 24 mmol/L (ref 22–32)
Calcium: 7.8 mg/dL — ABNORMAL LOW (ref 8.9–10.3)
Chloride: 107 mmol/L (ref 98–111)
Creatinine, Ser: 0.56 mg/dL (ref 0.44–1.00)
GFR calc Af Amer: >60 mL/min (ref >60)
GFR calc non Af Amer: >60 mL/min (ref >60)
Glucose, Bld: 111 mg/dL — ABNORMAL HIGH (ref 70–99)
Potassium: 3.3 mmol/L — ABNORMAL LOW (ref 3.5–5.1)
Sodium: 137 mmol/L (ref 135–145)

## 2020-06-12 LAB — GLUCOSE, CAPILLARY
Glucose-Capillary: 112 mg/dL — ABNORMAL HIGH (ref 70–99)
Glucose-Capillary: 118 mg/dL — ABNORMAL HIGH (ref 70–99)
Glucose-Capillary: 137 mg/dL — ABNORMAL HIGH (ref 70–99)
Glucose-Capillary: 166 mg/dL — ABNORMAL HIGH (ref 70–99)
Glucose-Capillary: 196 mg/dL — ABNORMAL HIGH (ref 70–99)
Glucose-Capillary: 197 mg/dL — ABNORMAL HIGH (ref 70–99)

## 2020-06-12 LAB — MAGNESIUM: Magnesium: 2 mg/dL (ref 1.7–2.4)

## 2020-06-12 MED ORDER — POLYETHYLENE GLYCOL 3350 17 G PO PACK
34.0000 g | PACK | ORAL | Status: AC
  Administered 2020-06-12 (×3): 34 g via ORAL
  Filled 2020-06-12 (×4): qty 2

## 2020-06-12 MED ORDER — POTASSIUM CHLORIDE IN NACL 40-0.9 MEQ/L-% IV SOLN
INTRAVENOUS | Status: AC
  Filled 2020-06-12 (×2): qty 1000

## 2020-06-12 MED ORDER — POTASSIUM CHLORIDE 10 MEQ/100ML IV SOLN
10.0000 meq | INTRAVENOUS | Status: DC
  Administered 2020-06-12: 10 meq via INTRAVENOUS
  Filled 2020-06-12 (×2): qty 100

## 2020-06-12 MED ORDER — MORPHINE SULFATE 15 MG PO TABS
15.0000 mg | ORAL_TABLET | Freq: Four times a day (QID) | ORAL | Status: DC | PRN
  Administered 2020-06-12 – 2020-06-18 (×5): 15 mg via ORAL
  Filled 2020-06-12 (×4): qty 1

## 2020-06-12 MED ORDER — KETOROLAC TROMETHAMINE 30 MG/ML IJ SOLN
30.0000 mg | Freq: Four times a day (QID) | INTRAMUSCULAR | Status: AC | PRN
  Administered 2020-06-12 – 2020-06-17 (×6): 30 mg via INTRAVENOUS
  Filled 2020-06-12 (×6): qty 1

## 2020-06-12 NOTE — Progress Notes (Addendum)
[facility] SURGICAL PROGRESS NOTE (cpt [phone])  Hospital Day(s): 4.   Interval History: Patient seen and examined, no acute events or new complaints overnight. Patient reports she continues to feel more improvement this morning still some upper abdominal soreness, denies fever, chills, nausea, emesis. Her CBC continues to be reassuring without evidence of leukocytosis or significant anemia. Renal function remains normal with sCr - 0.56, and good UO. Continues to have mild hypokalemia despite repletion, 3.3 this morning. She was without NGT yesterday. KUB this morning with persistently dilated loops of small bowel in the left mid/upper abdomen but there does appear to be gas throughout the colon. She continues to endorse flatus.  Review of Systems:  Constitutional: denies fever, chills  HEENT: denies cough or congestion  Respiratory: denies any shortness of breath  Cardiovascular: denies chest pain or palpitations  Gastrointestinal: + abdominal pain (improved), denied N/V, or diarrhea/and bowel function as per interval history Genitourinary: denies burning with urination or urinary frequency  Vital signs in last 24 hours: [min-max] current  Temp:  [98.3 F (36.8 C)-98.4 F (36.9 C)] 98.4 F (36.9 C) (08/20 0520) Pulse Rate:  [77-80] 77 (08/20 0520) Resp:  [14-20] 20 (08/20 0520) BP: (95-126)/(60-84) 113/82 (08/20 0520) SpO2:  [93 %-96 %] 93 % (08/20 0520)     Height: 5\' 3"  (160 cm) Weight: 72.3 kg BMI (Calculated): 28.24   Intake/Output last 2 shifts:  No intake/output data recorded.   Physical Exam:  Constitutional: alert, cooperative and no distress  HENT: normocephalic without obvious abnormality  Respiratory: breathing non-labored at rest  Cardiovascular: regular rate and sinus rhythm  Gastrointestinal: Soft, abdominal discomfort improved unable to illicit point tenderness, and non-distended, no rebound/guarding. Multiple surgical scars present consistent with  previous surgeries.  Musculoskeletal: no edema or wounds, motor and sensation grossly intact, NT  Psychiatric: She is extremely anxious   Labs:  CBC Latest Ref Rng & Units 06/12/2020 06/11/2020 06/10/2020  WBC 4.0 - 10.5 K/uL 9.8 9.5 10.6(H)  Hemoglobin 12.0 - 15.0 g/dL 11.9(L) 11.8(L) 12.4  Hematocrit 36 - 46 % 34.0(L) 33.7(L) 33.9(L)  Platelets 150 - 400 K/uL 233 233 251   CMP Latest Ref Rng & Units 06/12/2020 06/11/2020 06/10/2020  Glucose 70 - 99 mg/dL 111(H) 141(H) 198(H)  BUN 6 - 20 mg/dL 6 6 7   Creatinine 0.44 - 1.00 mg/dL 0.56 0.64 0.63  Sodium 135 - 145 mmol/L 137 140 137  Potassium 3.5 - 5.1 mmol/L 3.3(L) 3.4(L) 3.0(L)  Chloride 98 - 111 mmol/L 107 106 103  CO2 22 - 32 mmol/L 24 26 25   Calcium 8.9 - 10.3 mg/dL 7.8(L) 8.0(L) 8.2(L)  Total Protein 6.5 - 8.1 g/dL - - -  Total Bilirubin 0.3 - 1.2 mg/dL - - -  Alkaline Phos 38 - 126 U/L - - -  AST 15 - 41 U/L - - -  ALT 0 - 44 U/L - - -     Imaging studies:   KUB (06/12/2020) personally reviewed showing persistently dilated loops of bowel in the left mid/upper abdomen however there is gas and stool throughout the colon, and radiologist report pending:   Assessment/Plan: (ICD-10's: K49.609) 39 y.o. 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, complicated by multipe pertinent comorbidities including poorly controlled DM   - Will advance to CLD this morning  - Wean IVF   - Monitor abdominal examination; on-going bowel function              -  Pain control prn; antiemetics prn             - Monitor electrolytes prn; replete K+             - No emergent surgical intervention; she understands if she fails conservative management she would require intervention however given her extensive surgical history she is at increased risk for complication from this and I would hope to avoid if possible             - mobilization encouraged; she needs to ambulate in halls              - Further management per primary team; we will follow    All of the above findings and recommendations were discussed with the patient, patient's family at bedside, and the medical team, and all of their questions were answered to their expressed satisfaction.  -- Edison Simon, PA-C [facility] 06/12/2020, 7:23 AM [phone] M-F: 7am - 4pm  I saw and evaluated the patient.  I agree with the above documentation, exam, and plan, which I have edited where appropriate. Fredirick Maudlin  10:27 AM

## 2020-06-12 NOTE — Progress Notes (Signed)
Patient reported being nauseous at the beginning of shift with 1 episode of vomiting.  Patient rated pain 8/10 after PRN morphine. On call Surgeon called to make aware. Per MD Pabon NG tube needs to be placed again.  Nursing orders for NG already in place. However, patient refuses NG tube placement at this time saying "The miralax c/ 2 hrs is what caused me to get nauseous". This nurse explained the benefits of having an NG tube but pt continued to refuse.   Wynema Birch, RN

## 2020-06-12 NOTE — Progress Notes (Signed)
PROGRESS NOTE    Erica Bush  GGE:[id number] DOB: August 22, 1981 DOA: 06/08/2020 PCP: Patient, No Pcp Per   Chief complaint.  Abdominal pain.   Brief Narrative: Erica Bush  is a 39 y.o. Caucasian female with a known history of type 2 diabetes mellitus and endometrial cancer status post hysterectomy and status post appendectomy for ruptured appendix in the past and partial colon resection, who presented to the emergency room with acute onset of epigastric pain extending to the left lower quadrant with recurrent vomiting since this a.m. CT scan showed segmental dilation, small intestines consistent with small bowel obstruction.  Patient has been seen by general surgery, started IV fluids and symptomatic treatment.   Assessment & Plan:   Active Problems:   SBO (small bowel obstruction) ([place])   Uncontrolled type 2 diabetes mellitus with hyperglycemia ([place])  #1.  Small bowel obstruction. Most likely secondary to adhesion.  Patient has been seen by general surgery.  --NG tube pulled off twice already PLAN: --Hold off NG tube re-insertion and monitor for N/V --start clear liquid diet today, per Surgery --continue MIVF@50  --PO morphine for pain (avoid IV morphine due to soft BP)  2.  Uncontrolled type 2 diabetes with hyperglycemia. Patient currently does not have an insurance, does not have a PCP.  I have spoken with the social worker, will try to set up outpatient follow-up.  Patient will need prescription of NPH and regular insulin at time of discharge. --continue Lantus 10u daily --SSI q4h  # Constipation --High-dose Miralax 34g q2h for 5 doses or until BM.   DVT prophylaxis: Lovenox SQ Code Status: Full code  Family Communication: sister updated at bedside today Status is: inpatient Dispo:   The patient is from: home Anticipated d/c is to: home Anticipated d/c date is: 1-2 days Patient currently is not medically stable to d/c due to: clear liquid now, need to  advance diet per Surgery    Consultants:   General surgery.  Procedures: None Antimicrobials: None  Subjective: Pt complained of constipation and feeling of bloating.  Was seen walking the halls today to help bowel moving.  No N/V.     Objective: Vitals:   06/11/20 2013 06/12/20 0520 06/12/20 1149 06/12/20 1815  BP: 126/84 113/82 106/69 112/67  Pulse: 79 77 69 71  Resp: 20 20 16 20   Temp: 98.3 F (36.8 C) 98.4 F (36.9 C) 98.9 F (37.2 C) 98.7 F (37.1 C)  TempSrc:   Oral   SpO2: 96% 93% 100% 95%  Weight:      Height:        Intake/Output Summary (Last 24 hours) at 06/12/2020 1816 Last data filed at 06/12/2020 1700 Gross per 24 hour  Intake 136.67 ml  Output --  Net 136.67 ml   Filed Weights   06/08/20 2307  Weight: 72.3 kg    Examination:  Constitutional: NAD, AAOx3, walking the halls HEENT: conjunctivae and lids normal, EOMI CV:  No cyanosis.   RESP: normal respiratory effort  MSK: normal ROM and strength, no joint enlargement or tenderness of both UE and LE Neuro: II - XII grossly intact.  Psych: depressed mood and affect.  Appropriate judgement and reason    Data Reviewed: I have personally reviewed following labs and imaging studies  CBC: Recent Labs  Lab 06/08/20 1616 06/09/20 0439 06/10/20 0451 06/11/20 0413 06/12/20 0457  WBC 14.0* 12.9* 10.6* 9.5 9.8  NEUTROABS  --   --  7.6  --   --   HGB  14.7 13.7 12.4 11.8* 11.9*  HCT 40.7 38.8 33.9* 33.7* 34.0*  MCV 89.1 90.9 88.7 91.6 91.4  PLT 296 268 251 233 166   Basic Metabolic Panel: Recent Labs  Lab 06/08/20 1616 06/09/20 0439 06/10/20 0451 06/11/20 0413 06/12/20 0457  NA 135 133* 137 140 137  K 3.9 4.2 3.0* 3.4* 3.3*  CL 98 99 103 106 107  CO2 25 24 25 26 24   GLUCOSE 284* 362* 198* 141* 111*  BUN 8 7 7 6 6   CREATININE 0.48 0.59 0.63 0.64 0.56  CALCIUM 9.3 8.5* 8.2* 8.0* 7.8*  MG  --  2.0 2.0 2.2 2.0   GFR: Estimated Creatinine Clearance: 89.1 mL/min (by C-G formula based on  SCr of 0.56 mg/dL). Liver Function Tests: Recent Labs  Lab 06/08/20 1616  AST 27  ALT 39  ALKPHOS 106  BILITOT 0.7  PROT 7.5  ALBUMIN 3.9   Recent Labs  Lab 06/08/20 1616  LIPASE 27   No results for input(s): AMMONIA in the last 168 hours. Coagulation Profile: No results for input(s): INR, PROTIME in the last 168 hours. Cardiac Enzymes: No results for input(s): CKTOTAL, CKMB, CKMBINDEX, TROPONINI in the last 168 hours. BNP (last 3 results) No results for input(s): PROBNP in the last 8760 hours. HbA1C: No results for input(s): HGBA1C in the last 72 hours. CBG: Recent Labs  Lab 06/11/20 2014 06/12/20 0609 06/12/20 0737 06/12/20 1147 06/12/20 1554  GLUCAP 96 118* 112* 197* 137*   Lipid Profile: No results for input(s): CHOL, HDL, LDLCALC, TRIG, CHOLHDL, LDLDIRECT in the last 72 hours. Thyroid Function Tests: No results for input(s): TSH, T4TOTAL, FREET4, T3FREE, THYROIDAB in the last 72 hours. Anemia Panel: No results for input(s): VITAMINB12, FOLATE, FERRITIN, TIBC, IRON, RETICCTPCT in the last 72 hours. Sepsis Labs: No results for input(s): PROCALCITON, LATICACIDVEN in the last 168 hours.  Recent Results (from the past 240 hour(s))  SARS Coronavirus 2 by RT PCR (hospital order, performed in [facility] hospital lab) Nasopharyngeal Nasopharyngeal Swab     Status: None   Collection Time: 06/08/20 10:33 PM   Specimen: Nasopharyngeal Swab  Result Value Ref Range Status   SARS Coronavirus 2 NEGATIVE NEGATIVE Final    Comment: (NOTE) SARS-CoV-2 target nucleic acids are NOT DETECTED.  The SARS-CoV-2 RNA is generally detectable in upper and lower respiratory specimens during the acute phase of infection. The lowest concentration of SARS-CoV-2 viral copies this assay can detect is 250 copies / mL. A negative result does not preclude SARS-CoV-2 infection and should not be used as the sole basis for treatment or other patient management decisions.  A negative result may  occur with improper specimen collection / handling, submission of specimen other than nasopharyngeal swab, presence of viral mutation(s) within the areas targeted by this assay, and inadequate number of viral copies (<250 copies / mL). A negative result must be combined with clinical observations, patient history, and epidemiological information.  Fact Sheet for Patients:   StrictlyIdeas.no  Fact Sheet for Healthcare Providers: BankingDealers.co.za  This test is not yet approved or  cleared by the [place] FDA and has been authorized for detection and/or diagnosis of SARS-CoV-2 by FDA under an Emergency Use Authorization (EUA).  This EUA will remain in effect (meaning this test can be used) for the duration of the COVID-19 declaration under Section 564(b)(1) of the Act, 21 U.S.C. section 360bbb-3(b)(1), unless the authorization is terminated or revoked sooner.  Performed at [facility], [address] [place],  [place] [address]          Radiology Studies: DG Abd 1 View  Result Date: 06/12/2020 CLINICAL DATA:  Small bowel obstruction EXAM: ABDOMEN - 1 VIEW COMPARISON:  June 10, 2020. FINDINGS: Nasogastric tube no longer present. There are now multiple loops of dilated small bowel without appreciable air-fluid levels on supine examination. There is stool and air throughout the colon with air in the rectum. No free air appreciable. There is atelectatic change in the left base. There are surgical clips in the right upper abdomen. IMPRESSION: There is now small bowel dilatation without air-fluid levels on supine examination. Air is noted in the colon and rectum. Differential considerations include a degree of ileus or enteritis and possible degree of partial small bowel obstruction. No free air is demonstrable on supine examination. Stable apparent atelectasis left base. Electronically Signed   By: Lowella Grip III M.D.    On: 06/12/2020 08:22        Scheduled Meds: . enoxaparin (LOVENOX) injection  40 mg Subcutaneous Q24H  . insulin aspart  0-20 Units Subcutaneous Q4H  . insulin glargine  10 Units Subcutaneous Daily  . polyethylene glycol  34 g Oral Q2H   Continuous Infusions: . 0.9 % NaCl with KCl 40 mEq / L 50 mL/hr at 06/12/20 1416     LOS: 4 days    Enzo Bi, MD [facility]   To contact the attending provider between 7A-7P or the covering provider during after hours 7P-7A, please log into the web site [email or web address] and access using universal [place] password for that web site. If you do not have the password, please call the hospital operator.  06/12/2020, 6:16 PM

## 2020-06-13 ENCOUNTER — Inpatient Hospital Stay: Payer: Self-pay

## 2020-06-13 ENCOUNTER — Encounter: Payer: Self-pay | Admitting: Family Medicine

## 2020-06-13 LAB — BASIC METABOLIC PANEL
Anion gap: 8 (ref 5–15)
BUN: <5 mg/dL — ABNORMAL LOW (ref 6–20)
CO2: 25 mmol/L (ref 22–32)
Calcium: 8.1 mg/dL — ABNORMAL LOW (ref 8.9–10.3)
Chloride: 106 mmol/L (ref 98–111)
Creatinine, Ser: 0.72 mg/dL (ref 0.44–1.00)
GFR calc Af Amer: >60 mL/min (ref >60)
GFR calc non Af Amer: >60 mL/min (ref >60)
Glucose, Bld: 127 mg/dL — ABNORMAL HIGH (ref 70–99)
Potassium: 3.4 mmol/L — ABNORMAL LOW (ref 3.5–5.1)
Sodium: 139 mmol/L (ref 135–145)

## 2020-06-13 LAB — CBC
HCT: 33 % — ABNORMAL LOW (ref 36.0–46.0)
Hemoglobin: 11.4 g/dL — ABNORMAL LOW (ref 12.0–15.0)
MCH: 31.9 pg (ref 26.0–34.0)
MCHC: 34.5 g/dL (ref 30.0–36.0)
MCV: 92.4 fL (ref 80.0–100.0)
Platelets: 249 10*3/uL (ref 150–400)
RBC: 3.57 MIL/uL — ABNORMAL LOW (ref 3.87–5.11)
RDW: 12.2 % (ref 11.5–15.5)
WBC: 8.6 10*3/uL (ref 4.0–10.5)
nRBC: 0.2 % (ref 0.0–0.2)

## 2020-06-13 LAB — GLUCOSE, CAPILLARY
Glucose-Capillary: 131 mg/dL — ABNORMAL HIGH (ref 70–99)
Glucose-Capillary: 137 mg/dL — ABNORMAL HIGH (ref 70–99)
Glucose-Capillary: 174 mg/dL — ABNORMAL HIGH (ref 70–99)
Glucose-Capillary: 183 mg/dL — ABNORMAL HIGH (ref 70–99)
Glucose-Capillary: 187 mg/dL — ABNORMAL HIGH (ref 70–99)

## 2020-06-13 LAB — MAGNESIUM: Magnesium: 2.2 mg/dL (ref 1.7–2.4)

## 2020-06-13 MED ORDER — IOHEXOL 9 MG/ML PO SOLN
500.0000 mL | ORAL | Status: AC
  Administered 2020-06-13: 500 mL via ORAL

## 2020-06-13 MED ORDER — IOHEXOL 300 MG/ML  SOLN
100.0000 mL | Freq: Once | INTRAMUSCULAR | Status: AC | PRN
  Administered 2020-06-13: 100 mL via INTRAVENOUS

## 2020-06-13 MED ORDER — POTASSIUM CHLORIDE CRYS ER 20 MEQ PO TBCR
40.0000 meq | EXTENDED_RELEASE_TABLET | Freq: Once | ORAL | Status: DC
  Filled 2020-06-13: qty 2

## 2020-06-13 NOTE — Progress Notes (Signed)
CC: SBO Subjective: Patient had more pain last night now the pain is localized in the left lower quadrant and is colicky.  She thinks that she may have be having a diverticulitis attack.  She also had nausea and vomiting yesterday.  She attributes all this to the MiraLAX.  I have ordered a KUB this morning showing evidence of dilated loops of small bowel but also air within the colon.  This prompted another CT scan with contrast that I have personally reviewed showing evidence of intermittent dilation of the loops of small bowel.  No clear evidence of any complete bowel obstruction or internal hernias.  There is no free air and there is also gas within the colon. She has had a history of 3 major abdominal operations. CBC and BMP is completely normal other than mild hypokalemia.  Objective: Vital signs in last 24 hours: Temp:  [98.3 F (36.8 C)-98.8 F (37.1 C)] 98.5 F (36.9 C) (08/21 1133) Pulse Rate:  [57-71] 57 (08/21 1133) Resp:  [20] 20 (08/21 0446) BP: (104-117)/(67-85) 104/76 (08/21 1133) SpO2:  [93 %-97 %] 97 % (08/21 1133) Last BM Date: 06/13/20  Intake/Output from previous day: 08/20 0701 - 08/21 0700 In: 1052.5 [P.O.:240; I.V.:812.5] Out: 200 [Urine:200] Intake/Output this shift: Total I/O In: 174.2 [I.V.:174.2] Out: -   Physical exam: No acute distress.  Awake and alert Abdomen soft mild distention.  Mild tenderness to palpation left lower quadrant.  No peritonitis.  Lab Results: CBC  Recent Labs    06/12/20 0457 06/13/20 0504  WBC 9.8 8.6  HGB 11.9* 11.4*  HCT 34.0* 33.0*  PLT 233 249   BMET Recent Labs    06/12/20 0457 06/13/20 0504  NA 137 139  K 3.3* 3.4*  CL 107 106  CO2 24 25  GLUCOSE 111* 127*  BUN 6 <5*  CREATININE 0.56 0.72  CALCIUM 7.8* 8.1*   PT/INR No results for input(s): LABPROT, INR in the last 72 hours. ABG No results for input(s): PHART, HCO3 in the last 72 hours.  Invalid input(s): PCO2, PO2  Studies/Results: DG Abd 1  View  Result Date: 06/12/2020 CLINICAL DATA:  Small bowel obstruction EXAM: ABDOMEN - 1 VIEW COMPARISON:  June 10, 2020. FINDINGS: Nasogastric tube no longer present. There are now multiple loops of dilated small bowel without appreciable air-fluid levels on supine examination. There is stool and air throughout the colon with air in the rectum. No free air appreciable. There is atelectatic change in the left base. There are surgical clips in the right upper abdomen. IMPRESSION: There is now small bowel dilatation without air-fluid levels on supine examination. Air is noted in the colon and rectum. Differential considerations include a degree of ileus or enteritis and possible degree of partial small bowel obstruction. No free air is demonstrable on supine examination. Stable apparent atelectasis left base. Electronically Signed   By: Lowella Grip III M.D.   On: 06/12/2020 08:22   CT ABDOMEN PELVIS W CONTRAST  Result Date: 06/13/2020 CLINICAL DATA:  Bowel obstruction suspected. EXAM: CT ABDOMEN AND PELVIS WITH CONTRAST TECHNIQUE: Multidetector CT imaging of the abdomen and pelvis was performed using the standard protocol following bolus administration of intravenous contrast. CONTRAST:  146mL OMNIPAQUE IOHEXOL 300 MG/ML  SOLN COMPARISON:  Plain film of the abdomen acquired on June 13, 2020 and CT of the abdomen and pelvis from June 08, 2020 FINDINGS: Lower chest: Interval development of a small RIGHT and trace LEFT effusion. Basilar volume loss. No pericardial fluid. Hepatobiliary:  Small amount of perihepatic fluid has developed in the interval since the prior study. No focal, suspicious hepatic lesion. Liver span remains enlarged and there are signs of hepatic steatosis with low-density hepatic parenchyma. Liver span approximately 21 cm greatest craniocaudal extent. Portal vein is patent. Mild distension of the gallbladder with small amount of pericholecystic fluid, nonspecific in the setting of  perihepatic fluid. Pancreas: Pancreas is normal without ductal dilation or inflammation. Spleen: Spleen is mildly enlarged approximately 12 cm greatest craniocaudal extent, no focal splenic lesion. Axial dimension of the spleen approximately 15 cm. Adrenals/Urinary Tract: Adrenal glands are normal. Symmetric renal enhancement. No hydronephrosis. Urinary bladder is normal. Stomach/Bowel: Mild circumferential distal esophageal thickening. Stomach under distended limiting assessment. In the mid small bowel beginning with the proximal jejunum there is persistent small bowel dilation. There alternating loops of dilated and decompressed small bowel to the level of a transition in the mid abdomen on image 61 of series 2. There is gas and stool like material in the distal small bowel and there is stool and gas in the colon. Evidence of prior partial colonic resection with anastomotic changes in the low abdomen. Colonic diverticulosis. Vascular/Lymphatic: No aneurysmal dilation or significant atherosclerosis. The portal vein and SMV are patent. No adenopathy in the retroperitoneum. Status post lower retroperitoneal lymphadenectomy and upper pelvic lymphadenectomy. Reproductive: Post hysterectomy. Urinary bladder moderately distended. Other: Mild stranding in the midline of the abdominal wall, seen in the setting of generalized, mild-to-moderate body wall edema. No focal fluid collection. Small fat containing ventral hernia similar to the prior study. Musculoskeletal: No acute musculoskeletal process. Mild spinal degenerative changes. IMPRESSION: 1. Persistent small bowel dilation with alternating loops of dilated and decompressed small bowel to the level of a transition in the mid abdomen. Findings may represent a partial small bowel obstruction. Ileus related to enteritis is also considered. 2. Interval development of a small RIGHT and trace LEFT effusion with associated basilar collapse/consolidative change. 3. Interval  development of perihepatic ascites and small amount of pericholecystic fluid, findings are of uncertain significance. Correlate with liver enzymes and any signs of RIGHT upper quadrant pain. Findings are further confounded by the presence of developing anasarca with effusions and mild body wall edema. 4. Hepatosplenomegaly with signs of hepatic steatosis. Correlate with signs of liver disease which could also explain ascites. 5. Status post lower retroperitoneal lymphadenectomy and upper pelvic lymphadenectomy. 6. Aortic atherosclerosis. Aortic Atherosclerosis (ICD10-I70.0). Electronically Signed   By: Zetta Bills M.D.   On: 06/13/2020 15:47   DG ABD ACUTE 2+V W 1V CHEST  Result Date: 06/13/2020 CLINICAL DATA:  Small bowel obstruction EXAM: DG ABDOMEN ACUTE W/ 1V CHEST COMPARISON:  June 12, 2020 FINDINGS: Persistent mild opacity in left base.  Chest otherwise normal. Persistent small bowel obstruction, similar in the interval. No free air, portal venous gas, or pneumatosis. No other acute abnormalities are identified. IMPRESSION: Persistent small bowel obstruction. Persistent opacity in left lung base. Electronically Signed   By: Dorise Bullion III M.D   On: 06/13/2020 10:35    Anti-infectives: Anti-infectives (From admission, onward)   None      Assessment/Plan: Partial small bowel obstruction versus ileus.  Patient clinical picture is mixed.  There is no clear evidence of a complete bowel obstruction that would mandate surgical intervention at this time.  I will continue to follow clinical examination.  I will keep her on clear liquid diet for now until diarrhea resolves. Please note I have a spent at least 35 minutes in  this encounter with greater than 50% spent in coordination and counseling of patient care  Caroleen Hamman, MD, [facility]  06/13/2020

## 2020-06-13 NOTE — Plan of Care (Signed)
Patient has had 2 bowel movements. CT ordered with oral contrast.  Patient only able to drink 1 bottle due to nausea with Zofran onboard.  Patient reports pain to be better controlled.

## 2020-06-13 NOTE — Progress Notes (Signed)
PROGRESS NOTE    Erica Bush  KKX:[id number] DOB: [birth year] DOA: 06/08/2020 PCP: Patient, No Pcp Per   Chief complaint.  Abdominal pain.   Brief Narrative: Erica Bush  is a 39 y.o. Caucasian female with a known history of type 2 diabetes mellitus and endometrial cancer status post hysterectomy and status post appendectomy for ruptured appendix in the past and partial colon resection, who presented to the emergency room with acute onset of epigastric pain extending to the left lower quadrant with recurrent vomiting since this a.m. CT scan showed segmental dilation, small intestines consistent with small bowel obstruction.  Patient has been seen by general surgery, started IV fluids and symptomatic treatment.   Assessment & Plan:   Active Problems:   SBO (small bowel obstruction) ([place])   Uncontrolled type 2 diabetes mellitus with hyperglycemia ([place])  #1.  Small bowel obstruction. Most likely secondary to adhesion.  Patient has been seen by general surgery.  --NG tube pulled off twice already PLAN: --Hold off NG tube re-insertion and monitor for N/V --CT a/p ordered by GenSurg today, showed Persistent small bowel dilation. --continue clear liquid diet, per GenSurg --d/c MIVF --PO morphine for pain (avoid IV morphine due to soft BP)  2.  Uncontrolled type 2 diabetes with hyperglycemia. Patient currently does not have an insurance, does not have a PCP.  I have spoken with the social worker, will try to set up outpatient follow-up.  Patient will need prescription of NPH and regular insulin at time of discharge. --continue Lantus 10u daily --SSI q4h  # Constipation --s/p High-dose Miralax 34g q2h with BM achieved.     DVT prophylaxis: Lovenox SQ Code Status: Full code  Family Communication: sister updated at bedside today Status is: inpatient Dispo:   The patient is from: home Anticipated d/c is to: home Anticipated d/c date is: undetermined Patient currently  is not medically stable to d/c due to: persistent small bowel dilation, on clear liquid still, advance per GenSurg.    Consultants:   General surgery.  Procedures: None Antimicrobials: None  Subjective: Last night, pt complained of nausea due to Miralax.  This morning, pt reported large BM's (like child birth), after which, pt felt better, less bloated.  Still had abdominal tenderness.     Objective: Vitals:   06/12/20 1815 06/12/20 2008 06/13/20 0446 06/13/20 1133  BP: 112/67 117/85 105/73 104/76  Pulse: 71 65 63 (!) 57  Resp: 20 20 20    Temp: 98.7 F (37.1 C) 98.8 F (37.1 C) 98.3 F (36.8 C) 98.5 F (36.9 C)  TempSrc:  Oral Oral Oral  SpO2: 95% 93% 95% 97%  Weight:      Height:        Intake/Output Summary (Last 24 hours) at 06/13/2020 1853 Last data filed at 06/13/2020 1815 Gross per 24 hour  Intake 1090 ml  Output 200 ml  Net 890 ml   Filed Weights   06/08/20 2307  Weight: 72.3 kg    Examination:  Constitutional: NAD, AAOx3 HEENT: conjunctivae and lids normal, EOMI CV: RRR no M,R,G. Distal pulses +2.  No cyanosis.   RESP: CTA B/L, normal respiratory effort  GI: +BS, ND Extremities: No effusions, edema, or tenderness in BLE SKIN: warm, dry and intact Neuro: II - XII grossly intact.  Sensation intact Psych: Normal mood and affect.  Appropriate judgement and reason   Data Reviewed: I have personally reviewed following labs and imaging studies  CBC: Recent Labs  Lab 06/09/20 0439 06/10/20 0451 06/11/20  0413 06/12/20 0457 06/13/20 0504  WBC 12.9* 10.6* 9.5 9.8 8.6  NEUTROABS  --  7.6  --   --   --   HGB 13.7 12.4 11.8* 11.9* 11.4*  HCT 38.8 33.9* 33.7* 34.0* 33.0*  MCV 90.9 88.7 91.6 91.4 92.4  PLT 268 251 233 233 425   Basic Metabolic Panel: Recent Labs  Lab 06/09/20 0439 06/10/20 0451 06/11/20 0413 06/12/20 0457 06/13/20 0504  NA 133* 137 140 137 139  K 4.2 3.0* 3.4* 3.3* 3.4*  CL 99 103 106 107 106  CO2 24 25 26 24 25   GLUCOSE  362* 198* 141* 111* 127*  BUN 7 7 6 6  <5*  CREATININE 0.59 0.63 0.64 0.56 0.72  CALCIUM 8.5* 8.2* 8.0* 7.8* 8.1*  MG 2.0 2.0 2.2 2.0 2.2   GFR: Estimated Creatinine Clearance: 89.1 mL/min (by C-G formula based on SCr of 0.72 mg/dL). Liver Function Tests: Recent Labs  Lab 06/08/20 1616  AST 27  ALT 39  ALKPHOS 106  BILITOT 0.7  PROT 7.5  ALBUMIN 3.9   Recent Labs  Lab 06/08/20 1616  LIPASE 27   No results for input(s): AMMONIA in the last 168 hours. Coagulation Profile: No results for input(s): INR, PROTIME in the last 168 hours. Cardiac Enzymes: No results for input(s): CKTOTAL, CKMB, CKMBINDEX, TROPONINI in the last 168 hours. BNP (last 3 results) No results for input(s): PROBNP in the last 8760 hours. HbA1C: No results for input(s): HGBA1C in the last 72 hours. CBG: Recent Labs  Lab 06/12/20 2352 06/13/20 0406 06/13/20 0805 06/13/20 1132 06/13/20 1708  GLUCAP 196* 131* 137* 174* 183*   Lipid Profile: No results for input(s): CHOL, HDL, LDLCALC, TRIG, CHOLHDL, LDLDIRECT in the last 72 hours. Thyroid Function Tests: No results for input(s): TSH, T4TOTAL, FREET4, T3FREE, THYROIDAB in the last 72 hours. Anemia Panel: No results for input(s): VITAMINB12, FOLATE, FERRITIN, TIBC, IRON, RETICCTPCT in the last 72 hours. Sepsis Labs: No results for input(s): PROCALCITON, LATICACIDVEN in the last 168 hours.  Recent Results (from the past 240 hour(s))  SARS Coronavirus 2 by RT PCR (hospital order, performed in [facility] hospital lab) Nasopharyngeal Nasopharyngeal Swab     Status: None   Collection Time: 06/08/20 10:33 PM   Specimen: Nasopharyngeal Swab  Result Value Ref Range Status   SARS Coronavirus 2 NEGATIVE NEGATIVE Final    Comment: (NOTE) SARS-CoV-2 target nucleic acids are NOT DETECTED.  The SARS-CoV-2 RNA is generally detectable in upper and lower respiratory specimens during the acute phase of infection. The lowest concentration of SARS-CoV-2 viral  copies this assay can detect is 250 copies / mL. A negative result does not preclude SARS-CoV-2 infection and should not be used as the sole basis for treatment or other patient management decisions.  A negative result may occur with improper specimen collection / handling, submission of specimen other than nasopharyngeal swab, presence of viral mutation(s) within the areas targeted by this assay, and inadequate number of viral copies (<250 copies / mL). A negative result must be combined with clinical observations, patient history, and epidemiological information.  Fact Sheet for Patients:   StrictlyIdeas.no  Fact Sheet for Healthcare Providers: BankingDealers.co.za  This test is not yet approved or  cleared by the [place] FDA and has been authorized for detection and/or diagnosis of SARS-CoV-2 by FDA under an Emergency Use Authorization (EUA).  This EUA will remain in effect (meaning this test can be used) for the duration of the COVID-19 declaration under Section  564(b)(1) of the Act, 21 U.S.C. section 360bbb-3(b)(1), unless the authorization is terminated or revoked sooner.  Performed at [facility], [address] [place], [place] [address]          Radiology Studies: DG Abd 1 View  Result Date: 06/12/2020 CLINICAL DATA:  Small bowel obstruction EXAM: ABDOMEN - 1 VIEW COMPARISON:  June 10, 2020. FINDINGS: Nasogastric tube no longer present. There are now multiple loops of dilated small bowel without appreciable air-fluid levels on supine examination. There is stool and air throughout the colon with air in the rectum. No free air appreciable. There is atelectatic change in the left base. There are surgical clips in the right upper abdomen. IMPRESSION: There is now small bowel dilatation without air-fluid levels on supine examination. Air is noted in the colon and rectum. Differential considerations include a  degree of ileus or enteritis and possible degree of partial small bowel obstruction. No free air is demonstrable on supine examination. Stable apparent atelectasis left base. Electronically Signed   By: Lowella Grip III M.D.   On: 06/12/2020 08:22   CT ABDOMEN PELVIS W CONTRAST  Result Date: 06/13/2020 CLINICAL DATA:  Bowel obstruction suspected. EXAM: CT ABDOMEN AND PELVIS WITH CONTRAST TECHNIQUE: Multidetector CT imaging of the abdomen and pelvis was performed using the standard protocol following bolus administration of intravenous contrast. CONTRAST:  173mL OMNIPAQUE IOHEXOL 300 MG/ML  SOLN COMPARISON:  Plain film of the abdomen acquired on June 13, 2020 and CT of the abdomen and pelvis from June 08, 2020 FINDINGS: Lower chest: Interval development of a small RIGHT and trace LEFT effusion. Basilar volume loss. No pericardial fluid. Hepatobiliary: Small amount of perihepatic fluid has developed in the interval since the prior study. No focal, suspicious hepatic lesion. Liver span remains enlarged and there are signs of hepatic steatosis with low-density hepatic parenchyma. Liver span approximately 21 cm greatest craniocaudal extent. Portal vein is patent. Mild distension of the gallbladder with small amount of pericholecystic fluid, nonspecific in the setting of perihepatic fluid. Pancreas: Pancreas is normal without ductal dilation or inflammation. Spleen: Spleen is mildly enlarged approximately 12 cm greatest craniocaudal extent, no focal splenic lesion. Axial dimension of the spleen approximately 15 cm. Adrenals/Urinary Tract: Adrenal glands are normal. Symmetric renal enhancement. No hydronephrosis. Urinary bladder is normal. Stomach/Bowel: Mild circumferential distal esophageal thickening. Stomach under distended limiting assessment. In the mid small bowel beginning with the proximal jejunum there is persistent small bowel dilation. There alternating loops of dilated and decompressed small bowel  to the level of a transition in the mid abdomen on image 61 of series 2. There is gas and stool like material in the distal small bowel and there is stool and gas in the colon. Evidence of prior partial colonic resection with anastomotic changes in the low abdomen. Colonic diverticulosis. Vascular/Lymphatic: No aneurysmal dilation or significant atherosclerosis. The portal vein and SMV are patent. No adenopathy in the retroperitoneum. Status post lower retroperitoneal lymphadenectomy and upper pelvic lymphadenectomy. Reproductive: Post hysterectomy. Urinary bladder moderately distended. Other: Mild stranding in the midline of the abdominal wall, seen in the setting of generalized, mild-to-moderate body wall edema. No focal fluid collection. Small fat containing ventral hernia similar to the prior study. Musculoskeletal: No acute musculoskeletal process. Mild spinal degenerative changes. IMPRESSION: 1. Persistent small bowel dilation with alternating loops of dilated and decompressed small bowel to the level of a transition in the mid abdomen. Findings may represent a partial small bowel obstruction. Ileus related to enteritis is  also considered. 2. Interval development of a small RIGHT and trace LEFT effusion with associated basilar collapse/consolidative change. 3. Interval development of perihepatic ascites and small amount of pericholecystic fluid, findings are of uncertain significance. Correlate with liver enzymes and any signs of RIGHT upper quadrant pain. Findings are further confounded by the presence of developing anasarca with effusions and mild body wall edema. 4. Hepatosplenomegaly with signs of hepatic steatosis. Correlate with signs of liver disease which could also explain ascites. 5. Status post lower retroperitoneal lymphadenectomy and upper pelvic lymphadenectomy. 6. Aortic atherosclerosis. Aortic Atherosclerosis (ICD10-I70.0). Electronically Signed   By: Zetta Bills M.D.   On: 06/13/2020 15:47     DG ABD ACUTE 2+V W 1V CHEST  Result Date: 06/13/2020 CLINICAL DATA:  Small bowel obstruction EXAM: DG ABDOMEN ACUTE W/ 1V CHEST COMPARISON:  June 12, 2020 FINDINGS: Persistent mild opacity in left base.  Chest otherwise normal. Persistent small bowel obstruction, similar in the interval. No free air, portal venous gas, or pneumatosis. No other acute abnormalities are identified. IMPRESSION: Persistent small bowel obstruction. Persistent opacity in left lung base. Electronically Signed   By: Dorise Bullion III M.D   On: 06/13/2020 10:35        Scheduled Meds: . enoxaparin (LOVENOX) injection  40 mg Subcutaneous Q24H  . insulin aspart  0-20 Units Subcutaneous Q4H  . insulin glargine  10 Units Subcutaneous Daily  . potassium chloride  40 mEq Oral Once   Continuous Infusions:    LOS: 5 days    Enzo Bi, MD [facility]   To contact the attending provider between 7A-7P or the covering provider during after hours 7P-7A, please log into the web site [email or web address] and access using universal [place] password for that web site. If you do not have the password, please call the hospital operator.  06/13/2020, 6:53 PM

## 2020-06-14 ENCOUNTER — Inpatient Hospital Stay: Payer: Self-pay

## 2020-06-14 LAB — COMPREHENSIVE METABOLIC PANEL
ALT: 44 U/L (ref 0–44)
AST: 21 U/L (ref 15–41)
Albumin: 2.9 g/dL — ABNORMAL LOW (ref 3.5–5.0)
Alkaline Phosphatase: 89 U/L (ref 38–126)
Anion gap: 11 (ref 5–15)
BUN: 5 mg/dL — ABNORMAL LOW (ref 6–20)
CO2: 23 mmol/L (ref 22–32)
Calcium: 8.2 mg/dL — ABNORMAL LOW (ref 8.9–10.3)
Chloride: 103 mmol/L (ref 98–111)
Creatinine, Ser: 0.55 mg/dL (ref 0.44–1.00)
GFR calc Af Amer: >60 mL/min (ref >60)
GFR calc non Af Amer: >60 mL/min (ref >60)
Glucose, Bld: 117 mg/dL — ABNORMAL HIGH (ref 70–99)
Potassium: 3.3 mmol/L — ABNORMAL LOW (ref 3.5–5.1)
Sodium: 137 mmol/L (ref 135–145)
Total Bilirubin: 1 mg/dL (ref 0.3–1.2)
Total Protein: 5.7 g/dL — ABNORMAL LOW (ref 6.5–8.1)

## 2020-06-14 LAB — CBC
HCT: 33.4 % — ABNORMAL LOW (ref 36.0–46.0)
Hemoglobin: 12 g/dL (ref 12.0–15.0)
MCH: 32.4 pg (ref 26.0–34.0)
MCHC: 35.9 g/dL (ref 30.0–36.0)
MCV: 90.3 fL (ref 80.0–100.0)
Platelets: 255 10*3/uL (ref 150–400)
RBC: 3.7 MIL/uL — ABNORMAL LOW (ref 3.87–5.11)
RDW: 12.6 % (ref 11.5–15.5)
WBC: 8.9 10*3/uL (ref 4.0–10.5)
nRBC: 0.2 % (ref 0.0–0.2)

## 2020-06-14 LAB — GLUCOSE, CAPILLARY
Glucose-Capillary: 109 mg/dL — ABNORMAL HIGH (ref 70–99)
Glucose-Capillary: 121 mg/dL — ABNORMAL HIGH (ref 70–99)
Glucose-Capillary: 142 mg/dL — ABNORMAL HIGH (ref 70–99)
Glucose-Capillary: 149 mg/dL — ABNORMAL HIGH (ref 70–99)
Glucose-Capillary: 154 mg/dL — ABNORMAL HIGH (ref 70–99)
Glucose-Capillary: 161 mg/dL — ABNORMAL HIGH (ref 70–99)

## 2020-06-14 LAB — PROTIME-INR
INR: 1.1 (ref 0.8–1.2)
Prothrombin Time: 13.6 seconds (ref 11.4–15.2)

## 2020-06-14 LAB — MAGNESIUM: Magnesium: 2 mg/dL (ref 1.7–2.4)

## 2020-06-14 MED ORDER — MORPHINE SULFATE (PF) 4 MG/ML IV SOLN
4.0000 mg | INTRAVENOUS | Status: DC | PRN
  Administered 2020-06-14 – 2020-06-15 (×6): 4 mg via INTRAVENOUS
  Filled 2020-06-14 (×7): qty 1

## 2020-06-14 MED ORDER — POLYETHYLENE GLYCOL 3350 17 G PO PACK: 17.0000 g | PACK | Freq: Every day | ORAL | Status: DC

## 2020-06-14 MED ORDER — POTASSIUM CHLORIDE CRYS ER 20 MEQ PO TBCR
40.0000 meq | EXTENDED_RELEASE_TABLET | Freq: Two times a day (BID) | ORAL | Status: AC
  Administered 2020-06-14 (×2): 40 meq via ORAL
  Filled 2020-06-14 (×2): qty 2

## 2020-06-14 NOTE — Progress Notes (Signed)
06/14/2020 5:58 PM  Called into room by patient because she reported "Not feeling good."  Arrived in patient room to find her vomiting.  Notified Dr. Dahlia Byes who ordered diet changed back to clear liquids.  Suggested administration of Zofran as ordered.  Dola Argyle, RN   Dola Argyle, RN

## 2020-06-14 NOTE — Progress Notes (Signed)
PROGRESS NOTE    Erica Bush  WLN:[id number] DOB: 1981/06/04 DOA: 06/08/2020 PCP: Patient, No Pcp Per   Chief complaint.  Abdominal pain.   Brief Narrative: Erica Bush  is a 39 y.o. female with a known history of type 2 diabetes mellitus and endometrial cancer status post hysterectomy and status post appendectomy for ruptured appendix in the past and partial colon resection, who presented to the emergency room with acute onset of epigastric pain extending to the left lower quadrant with recurrent vomiting since this a.m. CT scan showed segmental dilation, small intestines consistent with small bowel obstruction.  Patient has been seen by general surgery, started IV fluids and symptomatic treatment.   Assessment & Plan:   Active Problems:   SBO (small bowel obstruction) ([place])   Uncontrolled type 2 diabetes mellitus with hyperglycemia ([place])  #1.  Small bowel obstruction. Most likely secondary to adhesion.  Patient has been seen by general surgery.  --NG tube pulled off twice already --CT a/p ordered by GenSurg on 8/21, showed Persistent small bowel dilation. PLAN: --Hold off NG tube re-insertion and monitor for N/V --diet advanced to Full liquid this morning, however, later was found to have vomited, so diet changed back to clear liquid by GenSurg. --PO morphine for pain (avoid IV morphine due to soft BP)  2.  Uncontrolled type 2 diabetes with hyperglycemia. Patient currently does not have an insurance, does not have a PCP.  I have spoken with the social worker, will try to set up outpatient follow-up.  Patient will need prescription of NPH and regular insulin at time of discharge. --continue Lantus 10u daily --SSI q4h resistant scale  # Hypokalemia --replete PRN  # Constipation --s/p High-dose Miralax 34g q2h with BM achieved.   --continue Miralax daily   DVT prophylaxis: Lovenox SQ Code Status: Full code  Family Communication: husband updated at bedside  today Status is: inpatient Dispo:   The patient is from: home Anticipated d/c is to: home Anticipated d/c date is: undetermined Patient currently is not medically stable to d/c due to: persistent small bowel dilation, on clear liquid still, could not tolerate advancement    Consultants:   General surgery.  Procedures: None Antimicrobials: None  Subjective: Continued to have BM, passing gas.  Abdominal pain improved.  Pt was happy to have her diet advanced to Full liquid this morning, however, later was found to have vomited, so diet changed back to clear liquid by GenSurg.   Objective: Vitals:   06/13/20 2011 06/14/20 0451 06/14/20 0455 06/14/20 1204  BP: 118/77 113/72 113/72 109/77  Pulse: 61  61 64  Resp: 18 18 18    Temp: 98.1 F (36.7 C) 98.3 F (36.8 C) 98.3 F (36.8 C)   TempSrc: Oral Oral Oral   SpO2: 100%  96% 100%  Weight:      Height:        Intake/Output Summary (Last 24 hours) at 06/14/2020 1703 Last data filed at 06/14/2020 1340 Gross per 24 hour  Intake 0 ml  Output --  Net 0 ml   Filed Weights   06/08/20 2307  Weight: 72.3 kg    Examination:  Constitutional: NAD, AAOx3 HEENT: conjunctivae and lids normal, EOMI CV: RRR no M,R,G. Distal pulses +2.  No cyanosis.   RESP: CTA B/L, normal respiratory effort  GI: +BS, NTND Extremities: No effusions, edema, or tenderness in BLE MSK: normal ROM and strength, no joint enlargement or tenderness of both UE and LE SKIN: warm, dry and intact  Neuro: II - XII grossly intact.  Sensation intact Psych: Normal mood and affect.  Appropriate judgement and reason   Data Reviewed: I have personally reviewed following labs and imaging studies  CBC: Recent Labs  Lab 06/10/20 0451 06/11/20 0413 06/12/20 0457 06/13/20 0504 06/14/20 0500  WBC 10.6* 9.5 9.8 8.6 8.9  NEUTROABS 7.6  --   --   --   --   HGB 12.4 11.8* 11.9* 11.4* 12.0  HCT 33.9* 33.7* 34.0* 33.0* 33.4*  MCV 88.7 91.6 91.4 92.4 90.3  PLT 251  233 233 249 829   Basic Metabolic Panel: Recent Labs  Lab 06/10/20 0451 06/11/20 0413 06/12/20 0457 06/13/20 0504 06/14/20 0500  NA 137 140 137 139 137  K 3.0* 3.4* 3.3* 3.4* 3.3*  CL 103 106 107 106 103  CO2 25 26 24 25 23   GLUCOSE 198* 141* 111* 127* 117*  BUN 7 6 6  <5* 5*  CREATININE 0.63 0.64 0.56 0.72 0.55  CALCIUM 8.2* 8.0* 7.8* 8.1* 8.2*  MG 2.0 2.2 2.0 2.2 2.0   GFR: Estimated Creatinine Clearance: 89.1 mL/min (by C-G formula based on SCr of 0.55 mg/dL). Liver Function Tests: Recent Labs  Lab 06/08/20 1616 06/14/20 0500  AST 27 21  ALT 39 44  ALKPHOS 106 89  BILITOT 0.7 1.0  PROT 7.5 5.7*  ALBUMIN 3.9 2.9*   Recent Labs  Lab 06/08/20 1616  LIPASE 27   No results for input(s): AMMONIA in the last 168 hours. Coagulation Profile: Recent Labs  Lab 06/14/20 0500  INR 1.1   Cardiac Enzymes: No results for input(s): CKTOTAL, CKMB, CKMBINDEX, TROPONINI in the last 168 hours. BNP (last 3 results) No results for input(s): PROBNP in the last 8760 hours. HbA1C: No results for input(s): HGBA1C in the last 72 hours. CBG: Recent Labs  Lab 06/14/20 0008 06/14/20 0457 06/14/20 0750 06/14/20 1203 06/14/20 1642  GLUCAP 109* 121* 154* 142* 161*   Lipid Profile: No results for input(s): CHOL, HDL, LDLCALC, TRIG, CHOLHDL, LDLDIRECT in the last 72 hours. Thyroid Function Tests: No results for input(s): TSH, T4TOTAL, FREET4, T3FREE, THYROIDAB in the last 72 hours. Anemia Panel: No results for input(s): VITAMINB12, FOLATE, FERRITIN, TIBC, IRON, RETICCTPCT in the last 72 hours. Sepsis Labs: No results for input(s): PROCALCITON, LATICACIDVEN in the last 168 hours.  Recent Results (from the past 240 hour(s))  SARS Coronavirus 2 by RT PCR (hospital order, performed in [facility] hospital lab) Nasopharyngeal Nasopharyngeal Swab     Status: None   Collection Time: 06/08/20 10:33 PM   Specimen: Nasopharyngeal Swab  Result Value Ref Range Status   SARS Coronavirus  2 NEGATIVE NEGATIVE Final    Comment: (NOTE) SARS-CoV-2 target nucleic acids are NOT DETECTED.  The SARS-CoV-2 RNA is generally detectable in upper and lower respiratory specimens during the acute phase of infection. The lowest concentration of SARS-CoV-2 viral copies this assay can detect is 250 copies / mL. A negative result does not preclude SARS-CoV-2 infection and should not be used as the sole basis for treatment or other patient management decisions.  A negative result may occur with improper specimen collection / handling, submission of specimen other than nasopharyngeal swab, presence of viral mutation(s) within the areas targeted by this assay, and inadequate number of viral copies (<250 copies / mL). A negative result must be combined with clinical observations, patient history, and epidemiological information.  Fact Sheet for Patients:   StrictlyIdeas.no  Fact Sheet for Healthcare Providers: BankingDealers.co.za  This test is  not yet approved or  cleared by the [place] and has been authorized for detection and/or diagnosis of SARS-CoV-2 by FDA under an Emergency Use Authorization (EUA).  This EUA will remain in effect (meaning this test can be used) for the duration of the COVID-19 declaration under Section 564(b)(1) of the Act, 21 U.S.C. section 360bbb-3(b)(1), unless the authorization is terminated or revoked sooner.  Performed at [facility], [address], [place], [place] [address]          Radiology Studies: CT ABDOMEN PELVIS W CONTRAST  Result Date: 06/13/2020 CLINICAL DATA:  Bowel obstruction suspected. EXAM: CT ABDOMEN AND PELVIS WITH CONTRAST TECHNIQUE: Multidetector CT imaging of the abdomen and pelvis was performed using the standard protocol following bolus administration of intravenous contrast. CONTRAST:  137mL OMNIPAQUE IOHEXOL 300 MG/ML  SOLN COMPARISON:  Plain film of the  abdomen acquired on June 13, 2020 and CT of the abdomen and pelvis from June 08, 2020 FINDINGS: Lower chest: Interval development of a small RIGHT and trace LEFT effusion. Basilar volume loss. No pericardial fluid. Hepatobiliary: Small amount of perihepatic fluid has developed in the interval since the prior study. No focal, suspicious hepatic lesion. Liver span remains enlarged and there are signs of hepatic steatosis with low-density hepatic parenchyma. Liver span approximately 21 cm greatest craniocaudal extent. Portal vein is patent. Mild distension of the gallbladder with small amount of pericholecystic fluid, nonspecific in the setting of perihepatic fluid. Pancreas: Pancreas is normal without ductal dilation or inflammation. Spleen: Spleen is mildly enlarged approximately 12 cm greatest craniocaudal extent, no focal splenic lesion. Axial dimension of the spleen approximately 15 cm. Adrenals/Urinary Tract: Adrenal glands are normal. Symmetric renal enhancement. No hydronephrosis. Urinary bladder is normal. Stomach/Bowel: Mild circumferential distal esophageal thickening. Stomach under distended limiting assessment. In the mid small bowel beginning with the proximal jejunum there is persistent small bowel dilation. There alternating loops of dilated and decompressed small bowel to the level of a transition in the mid abdomen on image 61 of series 2. There is gas and stool like material in the distal small bowel and there is stool and gas in the colon. Evidence of prior partial colonic resection with anastomotic changes in the low abdomen. Colonic diverticulosis. Vascular/Lymphatic: No aneurysmal dilation or significant atherosclerosis. The portal vein and SMV are patent. No adenopathy in the retroperitoneum. Status post lower retroperitoneal lymphadenectomy and upper pelvic lymphadenectomy. Reproductive: Post hysterectomy. Urinary bladder moderately distended. Other: Mild stranding in the midline of the  abdominal wall, seen in the setting of generalized, mild-to-moderate body wall edema. No focal fluid collection. Small fat containing ventral hernia similar to the prior study. Musculoskeletal: No acute musculoskeletal process. Mild spinal degenerative changes. IMPRESSION: 1. Persistent small bowel dilation with alternating loops of dilated and decompressed small bowel to the level of a transition in the mid abdomen. Findings may represent a partial small bowel obstruction. Ileus related to enteritis is also considered. 2. Interval development of a small RIGHT and trace LEFT effusion with associated basilar collapse/consolidative change. 3. Interval development of perihepatic ascites and small amount of pericholecystic fluid, findings are of uncertain significance. Correlate with liver enzymes and any signs of RIGHT upper quadrant pain. Findings are further confounded by the presence of developing anasarca with effusions and mild body wall edema. 4. Hepatosplenomegaly with signs of hepatic steatosis. Correlate with signs of liver disease which could also explain ascites. 5. Status post lower retroperitoneal lymphadenectomy and upper pelvic lymphadenectomy. 6. Aortic atherosclerosis. Aortic Atherosclerosis (  ICD10-I70.0). Electronically Signed   By: Zetta Bills M.D.   On: 06/13/2020 15:47   DG ABD ACUTE 2+V W 1V CHEST  Result Date: 06/13/2020 CLINICAL DATA:  Small bowel obstruction EXAM: DG ABDOMEN ACUTE W/ 1V CHEST COMPARISON:  June 12, 2020 FINDINGS: Persistent mild opacity in left base.  Chest otherwise normal. Persistent small bowel obstruction, similar in the interval. No free air, portal venous gas, or pneumatosis. No other acute abnormalities are identified. IMPRESSION: Persistent small bowel obstruction. Persistent opacity in left lung base. Electronically Signed   By: Dorise Bullion III M.D   On: 06/13/2020 10:35        Scheduled Meds:  enoxaparin (LOVENOX) injection  40 mg Subcutaneous  Q24H   insulin aspart  0-20 Units Subcutaneous Q4H   insulin glargine  10 Units Subcutaneous Daily   potassium chloride  40 mEq Oral BID   Continuous Infusions:    LOS: 6 days    Enzo Bi, MD [facility]   To contact the attending provider between 7A-7P or the covering provider during after hours 7P-7A, please log into the web site [email or web address] and access using universal [place] password for that web site. If you do not have the password, please call the hospital operator.  06/14/2020, 5:03 PM

## 2020-06-14 NOTE — Progress Notes (Signed)
CC: SBO Subjective: Passing gas and had a bowel movement.  Abdominal pain improving.  No nausea no vomiting tolerated clear liquids  Objective: Vital signs in last 24 hours: Temp:  [98.1 F (36.7 C)-98.3 F (36.8 C)] 98.3 F (36.8 C) (08/22 0455) Pulse Rate:  [61-64] 64 (08/22 1204) Resp:  [18] 18 (08/22 0455) BP: (109-118)/(72-77) 109/77 (08/22 1204) SpO2:  [96 %-100 %] 100 % (08/22 1204) Last BM Date: 06/13/20  Intake/Output from previous day: 08/21 0701 - 08/22 0700 In: 174.2 [I.V.:174.2] Out: -  Intake/Output this shift: No intake/output data recorded.  Physical exam:  NAD. Alert Abd: soft, decrease bs, no tenderness to palpation Ext: well perfused w/o edema  Lab Results: CBC  Recent Labs    06/13/20 0504 06/14/20 0500  WBC 8.6 8.9  HGB 11.4* 12.0  HCT 33.0* 33.4*  PLT 249 255   BMET Recent Labs    06/13/20 0504 06/14/20 0500  NA 139 137  K 3.4* 3.3*  CL 106 103  CO2 25 23  GLUCOSE 127* 117*  BUN <5* 5*  CREATININE 0.72 0.55  CALCIUM 8.1* 8.2*   PT/INR Recent Labs    06/14/20 0500  LABPROT 13.6  INR 1.1   ABG No results for input(s): PHART, HCO3 in the last 72 hours.  Invalid input(s): PCO2, PO2  Studies/Results: CT ABDOMEN PELVIS W CONTRAST  Result Date: 06/13/2020 CLINICAL DATA:  Bowel obstruction suspected. EXAM: CT ABDOMEN AND PELVIS WITH CONTRAST TECHNIQUE: Multidetector CT imaging of the abdomen and pelvis was performed using the standard protocol following bolus administration of intravenous contrast. CONTRAST:  167mL OMNIPAQUE IOHEXOL 300 MG/ML  SOLN COMPARISON:  Plain film of the abdomen acquired on June 13, 2020 and CT of the abdomen and pelvis from June 08, 2020 FINDINGS: Lower chest: Interval development of a small RIGHT and trace LEFT effusion. Basilar volume loss. No pericardial fluid. Hepatobiliary: Small amount of perihepatic fluid has developed in the interval since the prior study. No focal, suspicious hepatic lesion.  Liver span remains enlarged and there are signs of hepatic steatosis with low-density hepatic parenchyma. Liver span approximately 21 cm greatest craniocaudal extent. Portal vein is patent. Mild distension of the gallbladder with small amount of pericholecystic fluid, nonspecific in the setting of perihepatic fluid. Pancreas: Pancreas is normal without ductal dilation or inflammation. Spleen: Spleen is mildly enlarged approximately 12 cm greatest craniocaudal extent, no focal splenic lesion. Axial dimension of the spleen approximately 15 cm. Adrenals/Urinary Tract: Adrenal glands are normal. Symmetric renal enhancement. No hydronephrosis. Urinary bladder is normal. Stomach/Bowel: Mild circumferential distal esophageal thickening. Stomach under distended limiting assessment. In the mid small bowel beginning with the proximal jejunum there is persistent small bowel dilation. There alternating loops of dilated and decompressed small bowel to the level of a transition in the mid abdomen on image 61 of series 2. There is gas and stool like material in the distal small bowel and there is stool and gas in the colon. Evidence of prior partial colonic resection with anastomotic changes in the low abdomen. Colonic diverticulosis. Vascular/Lymphatic: No aneurysmal dilation or significant atherosclerosis. The portal vein and SMV are patent. No adenopathy in the retroperitoneum. Status post lower retroperitoneal lymphadenectomy and upper pelvic lymphadenectomy. Reproductive: Post hysterectomy. Urinary bladder moderately distended. Other: Mild stranding in the midline of the abdominal wall, seen in the setting of generalized, mild-to-moderate body wall edema. No focal fluid collection. Small fat containing ventral hernia similar to the prior study. Musculoskeletal: No acute musculoskeletal process. Mild spinal  degenerative changes. IMPRESSION: 1. Persistent small bowel dilation with alternating loops of dilated and decompressed  small bowel to the level of a transition in the mid abdomen. Findings may represent a partial small bowel obstruction. Ileus related to enteritis is also considered. 2. Interval development of a small RIGHT and trace LEFT effusion with associated basilar collapse/consolidative change. 3. Interval development of perihepatic ascites and small amount of pericholecystic fluid, findings are of uncertain significance. Correlate with liver enzymes and any signs of RIGHT upper quadrant pain. Findings are further confounded by the presence of developing anasarca with effusions and mild body wall edema. 4. Hepatosplenomegaly with signs of hepatic steatosis. Correlate with signs of liver disease which could also explain ascites. 5. Status post lower retroperitoneal lymphadenectomy and upper pelvic lymphadenectomy. 6. Aortic atherosclerosis. Aortic Atherosclerosis (ICD10-I70.0). Electronically Signed   By: Zetta Bills M.D.   On: 06/13/2020 15:47   DG ABD ACUTE 2+V W 1V CHEST  Result Date: 06/13/2020 CLINICAL DATA:  Small bowel obstruction EXAM: DG ABDOMEN ACUTE W/ 1V CHEST COMPARISON:  June 12, 2020 FINDINGS: Persistent mild opacity in left base.  Chest otherwise normal. Persistent small bowel obstruction, similar in the interval. No free air, portal venous gas, or pneumatosis. No other acute abnormalities are identified. IMPRESSION: Persistent small bowel obstruction. Persistent opacity in left lung base. Electronically Signed   By: Dorise Bullion III M.D   On: 06/13/2020 10:35    Anti-infectives: Anti-infectives (From admission, onward)   None      Assessment/Plan:  Nausea small bowel obstruction clinically improving.  No need for surgical intervention at this time.  Will slowly advance diet to full liquid diet.  Caroleen Hamman, MD, [facility]  06/14/2020

## 2020-06-15 ENCOUNTER — Inpatient Hospital Stay: Payer: Self-pay

## 2020-06-15 LAB — BASIC METABOLIC PANEL
Anion gap: 11 (ref 5–15)
BUN: 7 mg/dL (ref 6–20)
CO2: 23 mmol/L (ref 22–32)
Calcium: 8.4 mg/dL — ABNORMAL LOW (ref 8.9–10.3)
Chloride: 106 mmol/L (ref 98–111)
Creatinine, Ser: 0.75 mg/dL (ref 0.44–1.00)
GFR calc Af Amer: >60 mL/min (ref >60)
GFR calc non Af Amer: >60 mL/min (ref >60)
Glucose, Bld: 134 mg/dL — ABNORMAL HIGH (ref 70–99)
Potassium: 3.8 mmol/L (ref 3.5–5.1)
Sodium: 140 mmol/L (ref 135–145)

## 2020-06-15 LAB — CBC
HCT: 35.5 % — ABNORMAL LOW (ref 36.0–46.0)
Hemoglobin: 12.5 g/dL (ref 12.0–15.0)
MCH: 32 pg (ref 26.0–34.0)
MCHC: 35.2 g/dL (ref 30.0–36.0)
MCV: 90.8 fL (ref 80.0–100.0)
Platelets: 291 10*3/uL (ref 150–400)
RBC: 3.91 MIL/uL (ref 3.87–5.11)
RDW: 13.1 % (ref 11.5–15.5)
WBC: 9.8 10*3/uL (ref 4.0–10.5)
nRBC: 0.2 % (ref 0.0–0.2)

## 2020-06-15 LAB — GLUCOSE, CAPILLARY
Glucose-Capillary: 106 mg/dL — ABNORMAL HIGH (ref 70–99)
Glucose-Capillary: 121 mg/dL — ABNORMAL HIGH (ref 70–99)
Glucose-Capillary: 121 mg/dL — ABNORMAL HIGH (ref 70–99)
Glucose-Capillary: 126 mg/dL — ABNORMAL HIGH (ref 70–99)
Glucose-Capillary: 134 mg/dL — ABNORMAL HIGH (ref 70–99)
Glucose-Capillary: 138 mg/dL — ABNORMAL HIGH (ref 70–99)
Glucose-Capillary: 144 mg/dL — ABNORMAL HIGH (ref 70–99)

## 2020-06-15 LAB — MAGNESIUM: Magnesium: 2.3 mg/dL (ref 1.7–2.4)

## 2020-06-15 MED ORDER — POTASSIUM CHLORIDE IN NACL 40-0.9 MEQ/L-% IV SOLN
INTRAVENOUS | Status: DC
  Filled 2020-06-15 (×3): qty 1000

## 2020-06-15 NOTE — Progress Notes (Addendum)
[facility] SURGICAL PROGRESS NOTE (cpt [phone])  Hospital Day(s): 7.   Interval History: Patient seen and examined. Patient had diet advanced over the weekend however became nauseated overnight with episodes of emesis and ultimately had NGT replaced. This morning she reports that she has started to feel better. Labs are reassuring this morning as she is without leukocytosis, renal function is normal, and she is without electrolyte derangement. Still with BM and flatus.    Review of Systems:  Constitutional: denies fever, chills  HEENT: denies cough or congestion  Respiratory: denies any shortness of breath  Cardiovascular: denies chest pain or palpitations  Gastrointestinal: + abdominal pain (improved), + N/V (improved), or diarrhea/and bowel function as per interval history Genitourinary: denies burning with urination or urinary frequency Musculoskeletal: denies pain, decreased motor or sensation  Vital signs in last 24 hours: [min-max] current  Temp:  [98 F (36.7 C)-98.6 F (37 C)] 98 F (36.7 C) (08/23 0402) Pulse Rate:  [56-64] 63 (08/23 0402) Resp:  [18-19] 18 (08/23 0402) BP: (106-112)/(71-78) 106/71 (08/23 0402) SpO2:  [92 %-100 %] 92 % (08/23 0402)     Height: 5\' 3"  (160 cm) Weight: 72.3 kg BMI (Calculated): 28.24   Intake/Output last 2 shifts:  No intake/output data recorded.   Physical Exam:  Constitutional: alert, cooperative and no distress  HENT: normocephalic without obvious abnormality, NGT in place  Respiratory: breathing non-labored at rest  Cardiovascular: regular rate and sinus rhythm  Gastrointestinal: Soft, abdominal soreness improved, and non-distended, no rebound/guarding. Multiple surgical scars present consistent with previous surgeries.  Musculoskeletal: no edema or wounds, motor and sensation grossly intact, NT  Psychiatric: She is extremely anxious   Labs:  CBC Latest Ref Rng & Units 06/15/2020 06/14/2020 06/13/2020  WBC 4.0 - 10.5  K/uL 9.8 8.9 8.6  Hemoglobin 12.0 - 15.0 g/dL 12.5 12.0 11.4(L)  Hematocrit 36 - 46 % 35.5(L) 33.4(L) 33.0(L)  Platelets 150 - 400 K/uL 291 255 249   CMP Latest Ref Rng & Units 06/15/2020 06/14/2020 06/13/2020  Glucose 70 - 99 mg/dL 134(H) 117(H) 127(H)  BUN 6 - 20 mg/dL 7 5(L) <5(L)  Creatinine 0.44 - 1.00 mg/dL 0.75 0.55 0.72  Sodium 135 - 145 mmol/L 140 137 139  Potassium 3.5 - 5.1 mmol/L 3.8 3.3(L) 3.4(L)  Chloride 98 - 111 mmol/L 106 103 106  CO2 22 - 32 mmol/L 23 23 25   Calcium 8.9 - 10.3 mg/dL 8.4(L) 8.2(L) 8.1(L)  Total Protein 6.5 - 8.1 g/dL - 5.7(L) -  Total Bilirubin 0.3 - 1.2 mg/dL - 1.0 -  Alkaline Phos 38 - 126 U/L - 89 -  AST 15 - 41 U/L - 21 -  ALT 0 - 44 U/L - 44 -     Imaging studies:   KUB + CXR (06/15/2020) personally reviewed still with a few loops of dilated bowel in left abdomen, NGT in place, gas throughout descending colon, and radiologist report pending   Assessment/Plan: (ICD-10's: K66.609) 39 y.o. 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  - Monitor electrolytes while NPO    - Monitor abdominal examination; on-going bowel function              - Pain control  prn; antiemetics prn   - No emergent surgical intervention; she understands if she fails conservative management she would require intervention however given her extensive surgical history she is at increased risk for complication from this and I would hope to avoid if possible             - mobilization encouraged; she needs to ambulate in halls             - Further management per primary team; we will follow    All of the above findings and recommendations  were discussed with the patient, patient's family at bedside, and the medical team, and all of their questions were answered to their expressed satisfaction.  -- Edison Simon, PA-C [facility] 06/15/2020, 6:47 AM [phone] M-F: 7am - 4pm  I saw and evaluated the patient.  I agree with the above documentation, exam, and plan, which I have edited where appropriate. Fredirick Maudlin  9:42 AM

## 2020-06-15 NOTE — Progress Notes (Signed)
Initial Nutrition Assessment  DOCUMENTATION CODES:   Not applicable  INTERVENTION:   RD will monitor for diet advancement vs the need for nutrition support  Recommend TPN if unable to advance diet in the next 1-2 days  Pt at high refeed risk; recommend monitor K, Mg and P labs daily after initiation of nutrition   NUTRITION DIAGNOSIS:   Inadequate oral intake related to altered GI function (SBO) as evidenced by NPO status.  GOAL:   Patient will meet greater than or equal to 90% of their needs  MONITOR:   Diet advancement, Labs, Weight trends, Skin, I & O's  REASON FOR ASSESSMENT:   NPO/Clear Liquid Diet    ASSESSMENT:   39 y/o female with a known history of type 2 diabetes mellitus, endometrial cancer status post hysterectomy & appendectomy for ruptured appendix and partial colon resection who is admitted with SBO   Pt on mostly NPO/liquid diet since admit. Pt advanced to a soft diet on 8/22 but developed nausea and emesis overnight and was made NPO again. Pt was eating 100% of her meals on 8/22. NGT replaced to LIS. Pt reports that she is feeling better today. Pt is having flatus and BMs. Would recommend TPN if unable to advance her diet in the next 1-2 days. Pt is at refeed risk.   Medications reviewed and include: lovenox, insulin, miralax  Labs reviewed: K 3.8 wnl, Mg 2.3 wnl cbgs- 121, 134, 144 x 24 hrs AIC 8.9(H)- 8/16  NUTRITION - FOCUSED PHYSICAL EXAM:    Most Recent Value  Orbital Region No depletion  Upper Arm Region No depletion  Thoracic and Lumbar Region No depletion  Buccal Region No depletion  Temple Region No depletion  Clavicle Bone Region No depletion  Clavicle and Acromion Bone Region No depletion  Scapular Bone Region No depletion  Dorsal Hand No depletion  Patellar Region No depletion  Anterior Thigh Region No depletion  Posterior Calf Region No depletion  Edema (RD Assessment) None  Hair Reviewed  Eyes Reviewed  Mouth Reviewed  Skin  Reviewed  Nails Reviewed     Diet Order:   Diet Order            Diet NPO time specified Except for: Sips with Meds  Diet effective now                EDUCATION NEEDS:   No education needs have been identified at this time  Skin:  Skin Assessment: Reviewed RN Assessment  Last BM:  8/23  Height:   Ht Readings from Last 1 Encounters:  06/08/20 5\' 3"  (1.6 m)    Weight:   Wt Readings from Last 1 Encounters:  06/08/20 72.3 kg    Ideal Body Weight:  52.3 kg  BMI:  Body mass index is 28.24 kg/m.  Estimated Nutritional Needs:   Kcal:  1700-1900kcal/day  Protein:  85-95g/day  Fluid:  1.6-1.8L/day  Koleen Distance MS, RD, LDN Please refer to [facility], [place] for RD and/or RD on-call/weekend/after hours pager

## 2020-06-15 NOTE — Progress Notes (Signed)
PROGRESS NOTE    Erica Bush  [id number]:[id number] DOB: 10-Dec-1980 DOA: 06/08/2020 PCP: Patient, No Pcp Per   Chief complaint.  Abdominal pain.   Brief Narrative: Erica Bush  is a 39 y.o. female with a known history of type 2 diabetes mellitus and endometrial cancer status post hysterectomy and status post appendectomy for ruptured appendix in the past and partial colon resection, who presented to the emergency room with acute onset of epigastric pain extending to the left lower quadrant with recurrent vomiting since this a.m. CT scan showed segmental dilation, small intestines consistent with small bowel obstruction.  Patient has been seen by general surgery, started IV fluids and symptomatic treatment.   Assessment & Plan:   Active Problems:   SBO (small bowel obstruction) ([place])   Uncontrolled type 2 diabetes mellitus with hyperglycemia ([place])  #1.  Small bowel obstruction, persistent Most likely secondary to adhesion.  Patient has been seen by general surgery.  --NG tube pulled off/fell off twice already --CT a/p ordered by GenSurg on 8/21, showed Persistent small bowel dilation. --Pt had more abdominal pain last night, so NG tube re-inserted.   PLAN: --continue NG tube decompression --NPO again --resume NS+K@50   --morphine for pain  --ambulate in halls  2.  Uncontrolled type 2 diabetes with hyperglycemia. Patient currently does not have an insurance, does not have a PCP.  I have spoken with the social worker, will try to set up outpatient follow-up.  Patient will need prescription of NPH and regular insulin at time of discharge. --continue Lantus 10u daily --SSI q4h resistant scale  # Hypokalemia --replete PRN  # Constipation --s/p High-dose Miralax 34g q2h with BM achieved.   --continue Miralax daily   DVT prophylaxis: Lovenox SQ Code Status: Full code  Family Communication: husband updated at bedside today Status is: inpatient Dispo:   The patient is  from: home Anticipated d/c is to: home Anticipated d/c date is: undetermined Patient currently is not medically stable to d/c due to: persistent small bowel dilation, could not tolerate advancement, back on NG tube.    Consultants:   General surgery.  Procedures: None Antimicrobials: None  Subjective: Pt had more abdominal pain last night, so NG tube re-inserted.     Objective: Vitals:   06/14/20 1204 06/14/20 1947 06/15/20 0402 06/15/20 1200  BP: 109/77 112/78 106/71 104/74  Pulse: 64 (!) 56 63 (!) 51  Resp:  19 18   Temp:  98.6 F (37 C) 98 F (36.7 C) (!) 97.4 F (36.3 C)  TempSrc:  Oral Oral Oral  SpO2: 100% 93% 92% 100%  Weight:      Height:        Intake/Output Summary (Last 24 hours) at 06/15/2020 1524 Last data filed at 06/15/2020 1200 Gross per 24 hour  Intake --  Output 400 ml  Net -400 ml   Filed Weights   06/08/20 2307  Weight: 72.3 kg    Examination:  Constitutional: NAD, AAOx3, walking the halls HEENT: conjunctivae and lids normal, EOMI CV: No cyanosis.   RESP: normal respiratory effort  GI: NG tube present MSK: normal ROM and strength Neuro: II - XII grossly intact.  Sensation intact Psych: Normal mood and affect.  Appropriate judgement and reason   Data Reviewed: I have personally reviewed following labs and imaging studies  CBC: Recent Labs  Lab 06/10/20 0451 06/10/20 0451 06/11/20 0413 06/12/20 0457 06/13/20 0504 06/14/20 0500 06/15/20 0328  WBC 10.6*   < > 9.5 9.8 8.6 8.9 9.8  NEUTROABS 7.6  --   --   --   --   --   --   HGB 12.4   < > 11.8* 11.9* 11.4* 12.0 12.5  HCT 33.9*   < > 33.7* 34.0* 33.0* 33.4* 35.5*  MCV 88.7   < > 91.6 91.4 92.4 90.3 90.8  PLT 251   < > 233 233 249 255 291   < > = values in this interval not displayed.   Basic Metabolic Panel: Recent Labs  Lab 06/11/20 0413 06/12/20 0457 06/13/20 0504 06/14/20 0500 06/15/20 0328  NA 140 137 139 137 140  K 3.4* 3.3* 3.4* 3.3* 3.8  CL 106 107 106 103 106   CO2 26 24 25 23 23   GLUCOSE 141* 111* 127* 117* 134*  BUN 6 6 <5* 5* 7  CREATININE 0.64 0.56 0.72 0.55 0.75  CALCIUM 8.0* 7.8* 8.1* 8.2* 8.4*  MG 2.2 2.0 2.2 2.0 2.3   GFR: Estimated Creatinine Clearance: 88.2 mL/min (by C-G formula based on SCr of 0.75 mg/dL). Liver Function Tests: Recent Labs  Lab 06/08/20 1616 06/14/20 0500  AST 27 21  ALT 39 44  ALKPHOS 106 89  BILITOT 0.7 1.0  PROT 7.5 5.7*  ALBUMIN 3.9 2.9*   Recent Labs  Lab 06/08/20 1616  LIPASE 27   No results for input(s): AMMONIA in the last 168 hours. Coagulation Profile: Recent Labs  Lab 06/14/20 0500  INR 1.1   Cardiac Enzymes: No results for input(s): CKTOTAL, CKMB, CKMBINDEX, TROPONINI in the last 168 hours. BNP (last 3 results) No results for input(s): PROBNP in the last 8760 hours. HbA1C: No results for input(s): HGBA1C in the last 72 hours. CBG: Recent Labs  Lab 06/14/20 2027 06/15/20 0008 06/15/20 0351 06/15/20 0846 06/15/20 1159  GLUCAP 149* 121* 134* 144* 138*   Lipid Profile: No results for input(s): CHOL, HDL, LDLCALC, TRIG, CHOLHDL, LDLDIRECT in the last 72 hours. Thyroid Function Tests: No results for input(s): TSH, T4TOTAL, FREET4, T3FREE, THYROIDAB in the last 72 hours. Anemia Panel: No results for input(s): VITAMINB12, FOLATE, FERRITIN, TIBC, IRON, RETICCTPCT in the last 72 hours. Sepsis Labs: No results for input(s): PROCALCITON, LATICACIDVEN in the last 168 hours.  Recent Results (from the past 240 hour(s))  SARS Coronavirus 2 by RT PCR (hospital order, performed in [facility] hospital lab) Nasopharyngeal Nasopharyngeal Swab     Status: None   Collection Time: 06/08/20 10:33 PM   Specimen: Nasopharyngeal Swab  Result Value Ref Range Status   SARS Coronavirus 2 NEGATIVE NEGATIVE Final    Comment: (NOTE) SARS-CoV-2 target nucleic acids are NOT DETECTED.  The SARS-CoV-2 RNA is generally detectable in upper and lower respiratory specimens during the acute phase of  infection. The lowest concentration of SARS-CoV-2 viral copies this assay can detect is 250 copies / mL. A negative result does not preclude SARS-CoV-2 infection and should not be used as the sole basis for treatment or other patient management decisions.  A negative result may occur with improper specimen collection / handling, submission of specimen other than nasopharyngeal swab, presence of viral mutation(s) within the areas targeted by this assay, and inadequate number of viral copies (<250 copies / mL). A negative result must be combined with clinical observations, patient history, and epidemiological information.  Fact Sheet for Patients:   StrictlyIdeas.no  Fact Sheet for Healthcare Providers: BankingDealers.co.za  This test is not yet approved or  cleared by the [place] FDA and has been authorized for detection and/or diagnosis  of SARS-CoV-2 by FDA under an Emergency Use Authorization (EUA).  This EUA will remain in effect (meaning this test can be used) for the duration of the COVID-19 declaration under Section 564(b)(1) of the Act, 21 U.S.C. section 360bbb-3(b)(1), unless the authorization is terminated or revoked sooner.  Performed at [facility], [address], [place], [place] [address]          Radiology Studies: DG ABD ACUTE 2+V W 1V CHEST  Result Date: 06/15/2020 CLINICAL DATA:  Shortness of breath.  Recent small-bowel obstruction EXAM: DG ABDOMEN ACUTE W/ 1V CHEST COMPARISON:  Abdominal radiograph June 14, 2020; CT abdomen and pelvis June 13, 2020 FINDINGS: PA chest: There is ill-defined airspace opacity in the bases. Lungs otherwise are clear. Heart size and pulmonary vascularity are normal. No adenopathy. Supine and upright abdomen: Nasogastric tube tip and side port are in the stomach. There are several loops of borderline prominent small bowel with occasional air-fluid levels. There is  moderate stool in the colon. No evident free air. No abnormal calcifications. IMPRESSION: 1. Suspect patchy atelectasis with suspected foci of associated pneumonia in the bases. 2. There remain loops of mildly dilated bowel in the mid abdomen with occasional air-fluid levels. Less bowel dilatation compared to 2 days prior. Suspect partial but incomplete resolution of bowel obstruction. No evident free air. Moderate stool in colon. Electronically Signed   By: Lowella Grip III M.D.   On: 06/15/2020 08:15   DG Abd Portable 1V  Result Date: 06/14/2020 CLINICAL DATA:  NG tube placement EXAM: PORTABLE ABDOMEN - 1 VIEW COMPARISON:  None. FINDINGS: NG tube tip and side port project over the stomach. There is dilated small bowel in the central abdomen. IMPRESSION: NG tube tip and side port project within the stomach. Electronically Signed   By: Ulyses Jarred M.D.   On: 06/14/2020 22:54        Scheduled Meds: . enoxaparin (LOVENOX) injection  40 mg Subcutaneous Q24H  . insulin aspart  0-20 Units Subcutaneous Q4H  . insulin glargine  10 Units Subcutaneous Daily  . polyethylene glycol  17 g Oral Daily   Continuous Infusions:    LOS: 7 days    Enzo Bi, MD [facility]   To contact the attending provider between 7A-7P or the covering provider during after hours 7P-7A, please log into the web site [email or web address] and access using universal [place] password for that web site. If you do not have the password, please call the hospital operator.  06/15/2020, 3:24 PM

## 2020-06-16 ENCOUNTER — Inpatient Hospital Stay: Payer: Self-pay

## 2020-06-16 LAB — CBC
HCT: 36.5 % (ref 36.0–46.0)
Hemoglobin: 12.8 g/dL (ref 12.0–15.0)
MCH: 31.9 pg (ref 26.0–34.0)
MCHC: 35.1 g/dL (ref 30.0–36.0)
MCV: 91 fL (ref 80.0–100.0)
Platelets: 260 10*3/uL (ref 150–400)
RBC: 4.01 MIL/uL (ref 3.87–5.11)
RDW: 12.9 % (ref 11.5–15.5)
WBC: 9.5 10*3/uL (ref 4.0–10.5)
nRBC: 0 % (ref 0.0–0.2)

## 2020-06-16 LAB — RENAL FUNCTION PANEL
Albumin: 3.1 g/dL — ABNORMAL LOW (ref 3.5–5.0)
Anion gap: 10 (ref 5–15)
BUN: 8 mg/dL (ref 6–20)
CO2: 26 mmol/L (ref 22–32)
Calcium: 8.4 mg/dL — ABNORMAL LOW (ref 8.9–10.3)
Chloride: 106 mmol/L (ref 98–111)
Creatinine, Ser: 0.64 mg/dL (ref 0.44–1.00)
GFR calc Af Amer: >60 mL/min (ref >60)
GFR calc non Af Amer: >60 mL/min (ref >60)
Glucose, Bld: 99 mg/dL (ref 70–99)
Phosphorus: 6 mg/dL — ABNORMAL HIGH (ref 2.5–4.6)
Potassium: 4.3 mmol/L (ref 3.5–5.1)
Sodium: 142 mmol/L (ref 135–145)

## 2020-06-16 LAB — GLUCOSE, CAPILLARY
Glucose-Capillary: 106 mg/dL — ABNORMAL HIGH (ref 70–99)
Glucose-Capillary: 105 mg/dL — ABNORMAL HIGH (ref 70–99)
Glucose-Capillary: 96 mg/dL (ref 70–99)
Glucose-Capillary: 211 mg/dL — ABNORMAL HIGH (ref 70–99)
Glucose-Capillary: 157 mg/dL — ABNORMAL HIGH (ref 70–99)

## 2020-06-16 LAB — MAGNESIUM: Magnesium: 2.4 mg/dL (ref 1.7–2.4)

## 2020-06-16 LAB — TRIGLYCERIDES: Triglycerides: 161 mg/dL — ABNORMAL HIGH (ref <150)

## 2020-06-16 MED ORDER — BOOST / RESOURCE BREEZE PO LIQD CUSTOM
1.0000 | Freq: Three times a day (TID) | ORAL | Status: DC
  Administered 2020-06-16 (×2): 1 via ORAL

## 2020-06-16 MED ORDER — PANTOPRAZOLE SODIUM 40 MG IV SOLR
40.0000 mg | Freq: Two times a day (BID) | INTRAVENOUS | Status: DC
  Administered 2020-06-16 – 2020-06-18 (×5): 40 mg via INTRAVENOUS
  Filled 2020-06-16 (×6): qty 40

## 2020-06-16 NOTE — Progress Notes (Signed)
Patient completed clamping NGT trial.  Patient denies increased pain, nausea/vomitting.  NGT residual 65ml.  MD notified.

## 2020-06-16 NOTE — Progress Notes (Signed)
Pt was found to have a juice bottle at bedside from home. Pt was educated on the importance of staying NPO, however she insisted she needed to continue to  drink. Will continue to monitor.

## 2020-06-16 NOTE — Progress Notes (Signed)
PROGRESS NOTE    Zeda Gangwer  QJJ:[id number] DOB: 1981/02/08 DOA: 06/08/2020 PCP: Patient, No Pcp Per   Chief complaint.  Abdominal pain.   Brief Narrative: Marieann Zipp  is a 39 y.o. female with a known history of type 2 diabetes mellitus and endometrial cancer status post hysterectomy and status post appendectomy for ruptured appendix in the past and partial colon resection, who presented to the emergency room with acute onset of epigastric pain extending to the left lower quadrant with recurrent vomiting since this a.m. CT scan showed segmental dilation, small intestines consistent with small bowel obstruction.  Patient has been seen by general surgery, started IV fluids and symptomatic treatment.   Assessment & Plan:   Active Problems:   SBO (small bowel obstruction) ([place])   Uncontrolled type 2 diabetes mellitus with hyperglycemia ([place])  #1.  Small bowel obstruction, persistent Most likely secondary to adhesion.  Patient has been seen by general surgery.  --NG tube pulled off/fell off twice already --CT a/p ordered by GenSurg on 8/21, showed Persistent small bowel dilation. --Pt had more abdominal pain last night, so NG tube re-inserted on 8/23 PLAN: --NG tube clamping trial today, passed --resume clear liquid --d/c MIVF --morphine for pain  --ambulate in halls --management per GenSurg  2.  Uncontrolled type 2 diabetes with hyperglycemia. Patient currently does not have an insurance, does not have a PCP.  I have spoken with the social worker, will try to set up outpatient follow-up.  Patient will need prescription of NPH and regular insulin at time of discharge. --continue Lantus 10u daily --SSI q4h resistant scale  # Hypokalemia --replete PRN  # Constipation --s/p High-dose Miralax 34g q2h with BM achieved.   --continue Miralax daily   DVT prophylaxis: Lovenox SQ Code Status: Full code  Family Communication: husband updated at bedside today  06/17/20  Status is: inpatient Dispo:   The patient is from: home Anticipated d/c is to: home Anticipated d/c date is: undetermined Patient currently is not medically stable to d/c due to: persistent small bowel dilation, difficulty advancing diet.   Consultants:   General surgery.  Procedures: None Antimicrobials: None  Subjective: Reported same abdominal soreness.  Passed NG tube clamping trial, resumed clear liquid diet.   Objective: Vitals:   06/15/20 1200 06/15/20 1945 06/16/20 0505 06/16/20 1152  BP: 104/74 105/77 116/71 118/79  Pulse: (!) 51 (!) 57 (!) 56 (!) 54  Resp:  20 20   Temp: (!) 97.4 F (36.3 C) (!) 97.5 F (36.4 C) 98 F (36.7 C)   TempSrc: Oral Oral Oral   SpO2: 100% 97% 93% 95%  Weight:      Height:        Intake/Output Summary (Last 24 hours) at 06/16/2020 1456 Last data filed at 06/16/2020 1256 Gross per 24 hour  Intake 197.18 ml  Output 380 ml  Net -182.82 ml   Filed Weights   06/08/20 2307  Weight: 72.3 kg    Examination:  Constitutional: NAD, AAOx3, walking the halls HEENT: conjunctivae and lids normal, EOMI CV: RRR no M,R,G. Distal pulses +2.  No cyanosis.   RESP: CTA B/L, normal respiratory effort  GI:  No BS, ND, soft Extremities: No effusions, edema, or tenderness in BLE SKIN: warm, dry and intact Neuro: II - XII grossly intact.  Sensation intact Psych: Normal mood and affect.  Appropriate judgement and reason   Data Reviewed: I have personally reviewed following labs and imaging studies  CBC: Recent Labs  Lab 06/10/20  0451 06/11/20 0413 06/12/20 0457 06/13/20 0504 06/14/20 0500 06/15/20 0328 06/16/20 0413  WBC 10.6*   < > 9.8 8.6 8.9 9.8 9.5  NEUTROABS 7.6  --   --   --   --   --   --   HGB 12.4   < > 11.9* 11.4* 12.0 12.5 12.8  HCT 33.9*   < > 34.0* 33.0* 33.4* 35.5* 36.5  MCV 88.7   < > 91.4 92.4 90.3 90.8 91.0  PLT 251   < > 233 249 255 291 260   < > = values in this interval not displayed.   Basic  Metabolic Panel: Recent Labs  Lab 06/12/20 0457 06/13/20 0504 06/14/20 0500 06/15/20 0328 06/16/20 0413  NA 137 139 137 140 142  K 3.3* 3.4* 3.3* 3.8 4.3  CL 107 106 103 106 106  CO2 24 25 23 23 26   GLUCOSE 111* 127* 117* 134* 99  BUN 6 <5* 5* 7 8  CREATININE 0.56 0.72 0.55 0.75 0.64  CALCIUM 7.8* 8.1* 8.2* 8.4* 8.4*  MG 2.0 2.2 2.0 2.3 2.4  PHOS  --   --   --   --  6.0*   GFR: Estimated Creatinine Clearance: 88.2 mL/min (by C-G formula based on SCr of 0.64 mg/dL). Liver Function Tests: Recent Labs  Lab 06/14/20 0500 06/16/20 0413  AST 21  --   ALT 44  --   ALKPHOS 89  --   BILITOT 1.0  --   PROT 5.7*  --   ALBUMIN 2.9* 3.1*   No results for input(s): LIPASE, AMYLASE in the last 168 hours. No results for input(s): AMMONIA in the last 168 hours. Coagulation Profile: Recent Labs  Lab 06/14/20 0500  INR 1.1   Cardiac Enzymes: No results for input(s): CKTOTAL, CKMB, CKMBINDEX, TROPONINI in the last 168 hours. BNP (last 3 results) No results for input(s): PROBNP in the last 8760 hours. HbA1C: No results for input(s): HGBA1C in the last 72 hours. CBG: Recent Labs  Lab 06/15/20 1944 06/15/20 2309 06/16/20 0502 06/16/20 0721 06/16/20 1151  GLUCAP 121* 106* 106* 105* 96   Lipid Profile: Recent Labs    06/16/20 0413  TRIG 161*   Thyroid Function Tests: No results for input(s): TSH, T4TOTAL, FREET4, T3FREE, THYROIDAB in the last 72 hours. Anemia Panel: No results for input(s): VITAMINB12, FOLATE, FERRITIN, TIBC, IRON, RETICCTPCT in the last 72 hours. Sepsis Labs: No results for input(s): PROCALCITON, LATICACIDVEN in the last 168 hours.  Recent Results (from the past 240 hour(s))  SARS Coronavirus 2 by RT PCR (hospital order, performed in [facility] hospital lab) Nasopharyngeal Nasopharyngeal Swab     Status: None   Collection Time: 06/08/20 10:33 PM   Specimen: Nasopharyngeal Swab  Result Value Ref Range Status   SARS Coronavirus 2 NEGATIVE NEGATIVE  Final    Comment: (NOTE) SARS-CoV-2 target nucleic acids are NOT DETECTED.  The SARS-CoV-2 RNA is generally detectable in upper and lower respiratory specimens during the acute phase of infection. The lowest concentration of SARS-CoV-2 viral copies this assay can detect is 250 copies / mL. A negative result does not preclude SARS-CoV-2 infection and should not be used as the sole basis for treatment or other patient management decisions.  A negative result may occur with improper specimen collection / handling, submission of specimen other than nasopharyngeal swab, presence of viral mutation(s) within the areas targeted by this assay, and inadequate number of viral copies (<250 copies / mL). A negative result must  be combined with clinical observations, patient history, and epidemiological information.  Fact Sheet for Patients:   StrictlyIdeas.no  Fact Sheet for Healthcare Providers: BankingDealers.co.za  This test is not yet approved or  cleared by the [place] FDA and has been authorized for detection and/or diagnosis of SARS-CoV-2 by FDA under an Emergency Use Authorization (EUA).  This EUA will remain in effect (meaning this test can be used) for the duration of the COVID-19 declaration under Section 564(b)(1) of the Act, 21 U.S.C. section 360bbb-3(b)(1), unless the authorization is terminated or revoked sooner.  Performed at [facility], [address] [place], [place] [address]          Radiology Studies: DG Abd 1 View  Result Date: 06/16/2020 CLINICAL DATA:  Small bowel obstruction, NG tube, abdominal pain EXAM: ABDOMEN - 1 VIEW COMPARISON:  06/15/2020 abdominal radiograph FINDINGS: Enteric tube tip in the gastric fundus. Surgical clips overlie central abdomen. Multiple dilated small bowel loops in the left and central abdomen up to the 4.1 cm diameter, not substantially changed. Mild colonic stool. No  evidence of pneumatosis or pneumoperitoneum. No radiopaque nephrolithiasis. IMPRESSION: 1. Enteric tube tip in the gastric fundus. 2. Stable findings of mid to distal small bowel obstruction. Electronically Signed   By: Ilona Sorrel M.D.   On: 06/16/2020 09:55   DG ABD ACUTE 2+V W 1V CHEST  Result Date: 06/15/2020 CLINICAL DATA:  Shortness of breath.  Recent small-bowel obstruction EXAM: DG ABDOMEN ACUTE W/ 1V CHEST COMPARISON:  Abdominal radiograph June 14, 2020; CT abdomen and pelvis June 13, 2020 FINDINGS: PA chest: There is ill-defined airspace opacity in the bases. Lungs otherwise are clear. Heart size and pulmonary vascularity are normal. No adenopathy. Supine and upright abdomen: Nasogastric tube tip and side port are in the stomach. There are several loops of borderline prominent small bowel with occasional air-fluid levels. There is moderate stool in the colon. No evident free air. No abnormal calcifications. IMPRESSION: 1. Suspect patchy atelectasis with suspected foci of associated pneumonia in the bases. 2. There remain loops of mildly dilated bowel in the mid abdomen with occasional air-fluid levels. Less bowel dilatation compared to 2 days prior. Suspect partial but incomplete resolution of bowel obstruction. No evident free air. Moderate stool in colon. Electronically Signed   By: Lowella Grip III M.D.   On: 06/15/2020 08:15   DG Abd Portable 1V  Result Date: 06/14/2020 CLINICAL DATA:  NG tube placement EXAM: PORTABLE ABDOMEN - 1 VIEW COMPARISON:  None. FINDINGS: NG tube tip and side port project over the stomach. There is dilated small bowel in the central abdomen. IMPRESSION: NG tube tip and side port project within the stomach. Electronically Signed   By: Ulyses Jarred M.D.   On: 06/14/2020 22:54        Scheduled Meds: . enoxaparin (LOVENOX) injection  40 mg Subcutaneous Q24H  . feeding supplement  1 Container Oral TID BM  . insulin aspart  0-20 Units Subcutaneous Q4H  .  insulin glargine  10 Units Subcutaneous Daily  . pantoprazole (PROTONIX) IV  40 mg Intravenous Q12H  . polyethylene glycol  17 g Oral Daily   Continuous Infusions: . 0.9 % NaCl with KCl 40 mEq / L 50 mL/hr at 06/15/20 2256     LOS: 8 days    Enzo Bi, MD [facility]   To contact the attending provider between 7A-7P or the covering provider during after hours 7P-7A, please log into the web site [email or web address] and  access using universal [place] password for that web site. If you do not have the password, please call the hospital operator.  06/16/2020, 2:56 PM

## 2020-06-16 NOTE — Progress Notes (Addendum)
[facility] SURGICAL PROGRESS NOTE (cpt [phone])  Hospital Day(s): 8.   Interval History: Patient seen and examined, no acute events or new complaints overnight. Patient reports she feels improved and only complaining of centralized abdominal soreness, denies fever, chills, nausea, or emesis. Labs are reassuring this morning as she is without leukocytosis, renal function is normal, and she is without electrolyte derangement. NGT output recorded at 550 in last 24 hours however she has reportedly continued to have PO intake despite NGT. No BM in last 24 hours, but she continues to consistently pass flatus.   Review of Systems:  Constitutional: denies fever, chills  HEENT: denies cough or congestion  Respiratory: denies any shortness of breath  Cardiovascular: denies chest pain or palpitations  Gastrointestinal: + abdominal pain (improved), denied N/V, or diarrhea/and bowel function as per interval history Genitourinary: denies burning with urination or urinary frequency Musculoskeletal: denies pain, decreased motor or sensation  Vital signs in last 24 hours: [min-max] current  Temp:  [97.4 F (36.3 C)-98 F (36.7 C)] 98 F (36.7 C) (08/24 0505) Pulse Rate:  [51-57] 56 (08/24 0505) Resp:  [20] 20 (08/24 0505) BP: (104-116)/(71-77) 116/71 (08/24 0505) SpO2:  [93 %-100 %] 93 % (08/24 0505)     Height: 5\' 3"  (160 cm) Weight: 72.3 kg BMI (Calculated): 28.24   Intake/Output last 2 shifts:  08/23 0701 - 08/24 0700 In: 197.2 [I.V.:197.2] Out: 550 [Emesis/NG output:550]   Physical Exam:  Constitutional: alert, cooperative and no distress  HENT: normocephalic without obvious abnormality, NGT in place with what appears to be cranberry or grape juice in the canister  Respiratory: breathing non-labored at rest  Cardiovascular: regular rate and sinus rhythm  Gastrointestinal: Soft, central abdominal soreness improved, and non-distended, no rebound/guarding. Multiple surgical scars  present consistent with previous surgeries.  Musculoskeletal: no edema or wounds, motor and sensation grossly intact, NT  Psychiatric: She is extremely anxious   Labs:  CBC Latest Ref Rng & Units 06/16/2020 06/15/2020 06/14/2020  WBC 4.0 - 10.5 K/uL 9.5 9.8 8.9  Hemoglobin 12.0 - 15.0 g/dL 12.8 12.5 12.0  Hematocrit 36 - 46 % 36.5 35.5(L) 33.4(L)  Platelets 150 - 400 K/uL 260 291 255   CMP Latest Ref Rng & Units 06/16/2020 06/15/2020 06/14/2020  Glucose 70 - 99 mg/dL 99 134(H) 117(H)  BUN 6 - 20 mg/dL 8 7 5(L)  Creatinine 0.44 - 1.00 mg/dL 0.64 0.75 0.55  Sodium 135 - 145 mmol/L 142 140 137  Potassium 3.5 - 5.1 mmol/L 4.3 3.8 3.3(L)  Chloride 98 - 111 mmol/L 106 106 103  CO2 22 - 32 mmol/L 26 23 23   Calcium 8.9 - 10.3 mg/dL 8.4(L) 8.4(L) 8.2(L)  Total Protein 6.5 - 8.1 g/dL - - 5.7(L)  Total Bilirubin 0.3 - 1.2 mg/dL - - 1.0  Alkaline Phos 38 - 126 U/L - - 89  AST 15 - 41 U/L - - 21  ALT 0 - 44 U/L - - 44    Imaging studies: No new pertinent imaging studies   Assessment/Plan: (ICD-10's: K46.609) 39 y.o. 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, complicated by multipe pertinent comorbidities including poorly controlled DM   - I will get KUB as she is difficult historian  - I think it is reasonable to attempt clamping trial this morning  - Continue NPO for now; will try and avoid TPN but will continue to reassess need  - Monitor abdominal examination; on-going bowel function              -  Pain control prn; antiemetics prn              - No emergent surgical intervention; she understands if she fails conservative management she would require intervention however given her extensive surgical history she is at increased risk for complication from this and I would hope to avoid if possible             - mobilization encouraged; she needs to ambulate in halls             - Further management per  primary team; we will follow    All of the above findings and recommendations were discussed with the patient, patient's family at bedside, and the medical team, and all of their questions were answered to their expressed satisfaction.   -- Edison Simon, PA-C [facility] 06/16/2020, 6:52 AM [phone] M-F: 7am - 4pm  I saw and evaluated the patient.  I agree with the above documentation, exam, and plan, which I have edited where appropriate. Fredirick Maudlin  9:19 AM

## 2020-06-17 LAB — CBC
HCT: 37.2 % (ref 36.0–46.0)
Hemoglobin: 13.2 g/dL (ref 12.0–15.0)
MCH: 31.8 pg (ref 26.0–34.0)
MCHC: 35.5 g/dL (ref 30.0–36.0)
MCV: 89.6 fL (ref 80.0–100.0)
Platelets: 276 10*3/uL (ref 150–400)
RBC: 4.15 MIL/uL (ref 3.87–5.11)
RDW: 12.7 % (ref 11.5–15.5)
WBC: 10.1 10*3/uL (ref 4.0–10.5)
nRBC: 0 % (ref 0.0–0.2)

## 2020-06-17 LAB — BASIC METABOLIC PANEL
Anion gap: 7 (ref 5–15)
BUN: 7 mg/dL (ref 6–20)
CO2: 25 mmol/L (ref 22–32)
Calcium: 8.3 mg/dL — ABNORMAL LOW (ref 8.9–10.3)
Chloride: 108 mmol/L (ref 98–111)
Creatinine, Ser: 0.67 mg/dL (ref 0.44–1.00)
GFR calc Af Amer: >60 mL/min (ref >60)
GFR calc non Af Amer: >60 mL/min (ref >60)
Glucose, Bld: 134 mg/dL — ABNORMAL HIGH (ref 70–99)
Potassium: 3.6 mmol/L (ref 3.5–5.1)
Sodium: 140 mmol/L (ref 135–145)

## 2020-06-17 LAB — GLUCOSE, CAPILLARY
Glucose-Capillary: 103 mg/dL — ABNORMAL HIGH (ref 70–99)
Glucose-Capillary: 115 mg/dL — ABNORMAL HIGH (ref 70–99)
Glucose-Capillary: 127 mg/dL — ABNORMAL HIGH (ref 70–99)
Glucose-Capillary: 175 mg/dL — ABNORMAL HIGH (ref 70–99)
Glucose-Capillary: 186 mg/dL — ABNORMAL HIGH (ref 70–99)
Glucose-Capillary: 217 mg/dL — ABNORMAL HIGH (ref 70–99)
Glucose-Capillary: 262 mg/dL — ABNORMAL HIGH (ref 70–99)

## 2020-06-17 LAB — PHOSPHORUS: Phosphorus: 5.1 mg/dL — ABNORMAL HIGH (ref 2.5–4.6)

## 2020-06-17 LAB — MAGNESIUM: Magnesium: 2.2 mg/dL (ref 1.7–2.4)

## 2020-06-17 MED ORDER — NEPRO/CARBSTEADY PO LIQD
237.0000 mL | Freq: Two times a day (BID) | ORAL | Status: DC
  Administered 2020-06-17: 237 mL via ORAL

## 2020-06-17 NOTE — Progress Notes (Signed)
PROGRESS NOTE    Erica Bush  EYC:[id number] DOB: 02-12-1981 DOA: 06/08/2020 PCP: Patient, No Pcp Per   Chief complaint.  Abdominal pain.  Brief Narrative:  Erica Bush a49 y.o.femalewith a known history oftype 2 diabetes mellitus and endometrial cancer status post hysterectomy and status post appendectomy for ruptured appendix in the pastand partial colon resection, who presented to the emergency room with acute onset of epigastric painextending to the left lower quadrantwith recurrent vomiting since this a.m. CT scan showed segmental dilation, small intestines consistent with small bowel obstruction.  Patient has been seen by general surgery, started IV fluids and symptomatic treatment.   Assessment & Plan:   Active Problems:   SBO (small bowel obstruction) ([place])   Uncontrolled type 2 diabetes mellitus with hyperglycemia ([place])  #1. small bowel obstruction secondary to adhesion. Patient condition improving, tolerating liquid diet.  Diet advanced to full liquid by general surgery.  Patient has passed gas as well as a small bowel movement today.  #2.  Uncontrolled type 2 diabetes with hyperglycemia. Continue lower dose Lantus as well as sliding scale insulin.  Adjust dose as needed.  3.  Hypokalemia. Potassium has normalized.      DVT prophylaxis: Lovenox Code Status: Full Family Communication: None Disposition Plan:  . Patient came from: Home            . Anticipated d/c place: Home . Barriers to d/c OR conditions which need to be met to effect a safe d/c:   Consultants:   General surgery  Procedures:None Antimicrobials:None  Subjective: Patient doing much better today.  She has passed gas also had a small bowel movement today.  No nausea vomiting.  No abdominal pain. Diet has advanced to full liquid by general surgery.  Objective: Vitals:   06/16/20 1152 06/16/20 2008 06/17/20 0426 06/17/20 1146  BP: 118/79 125/68 110/69 96/65    Pulse: (!) 54 (!) 56 (!) 57 60  Resp:  '20 16 20  ' Temp:  98.1 F (36.7 C) 97.7 F (36.5 C) 98.1 F (36.7 C)  TempSrc:  Oral Oral Oral  SpO2: 95% 96% 99% 99%  Weight:      Height:        Intake/Output Summary (Last 24 hours) at 06/17/2020 1152 Last data filed at 06/17/2020 0426 Gross per 24 hour  Intake 0 ml  Output 30 ml  Net -30 ml   Filed Weights   06/08/20 2307  Weight: 72.3 kg    Examination:  General exam: Appears calm and comfortable  Respiratory system: Clear to auscultation. Respiratory effort normal. Cardiovascular system: S1 & S2 heard, RRR. No JVD, murmurs, rubs, gallops or clicks. No pedal edema. Gastrointestinal system: Abdomen is nondistended, soft and nontender. No organomegaly or masses felt. Normal bowel sounds heard. Central nervous system: Alert and oriented. No focal neurological deficits. Extremities: Symmetric 5 x 5 power. Skin: No rashes, lesions or ulcers Psychiatry: Judgement and insight appear normal. Mood & affect appropriate.     Data Reviewed: I have personally reviewed following labs and imaging studies  CBC: Recent Labs  Lab 06/13/20 0504 06/14/20 0500 06/15/20 0328 06/16/20 0413 06/17/20 0447  WBC 8.6 8.9 9.8 9.5 10.1  HGB 11.4* 12.0 12.5 12.8 13.2  HCT 33.0* 33.4* 35.5* 36.5 37.2  MCV 92.4 90.3 90.8 91.0 89.6  PLT 249 255 291 260 149   Basic Metabolic Panel: Recent Labs  Lab 06/13/20 0504 06/14/20 0500 06/15/20 0328 06/16/20 0413 06/17/20 0447  NA 139 137 140 142 140  K  3.4* 3.3* 3.8 4.3 3.6  CL 106 103 106 106 108  CO2 '25 23 23 26 25  ' GLUCOSE 127* 117* 134* 99 134*  BUN <5* 5* '7 8 7  ' CREATININE 0.72 0.55 0.75 0.64 0.67  CALCIUM 8.1* 8.2* 8.4* 8.4* 8.3*  MG 2.2 2.0 2.3 2.4 2.2  PHOS  --   --   --  6.0* 5.1*   GFR: Estimated Creatinine Clearance: 88.2 mL/min (by C-G formula based on SCr of 0.67 mg/dL). Liver Function Tests: Recent Labs  Lab 06/14/20 0500 06/16/20 0413  AST 21  --   ALT 44  --   ALKPHOS 89   --   BILITOT 1.0  --   PROT 5.7*  --   ALBUMIN 2.9* 3.1*   No results for input(s): LIPASE, AMYLASE in the last 168 hours. No results for input(s): AMMONIA in the last 168 hours. Coagulation Profile: Recent Labs  Lab 06/14/20 0500  INR 1.1   Cardiac Enzymes: No results for input(s): CKTOTAL, CKMB, CKMBINDEX, TROPONINI in the last 168 hours. BNP (last 3 results) No results for input(s): PROBNP in the last 8760 hours. HbA1C: No results for input(s): HGBA1C in the last 72 hours. CBG: Recent Labs  Lab 06/16/20 2005 06/17/20 0032 06/17/20 0423 06/17/20 0753 06/17/20 1141  GLUCAP 157* 175* 127* 115* 262*   Lipid Profile: Recent Labs    06/16/20 0413  TRIG 161*   Thyroid Function Tests: No results for input(s): TSH, T4TOTAL, FREET4, T3FREE, THYROIDAB in the last 72 hours. Anemia Panel: No results for input(s): VITAMINB12, FOLATE, FERRITIN, TIBC, IRON, RETICCTPCT in the last 72 hours. Sepsis Labs: No results for input(s): PROCALCITON, LATICACIDVEN in the last 168 hours.  Recent Results (from the past 240 hour(s))  SARS Coronavirus 2 by RT PCR (hospital order, performed in [facility] hospital lab) Nasopharyngeal Nasopharyngeal Swab     Status: None   Collection Time: 06/08/20 10:33 PM   Specimen: Nasopharyngeal Swab  Result Value Ref Range Status   SARS Coronavirus 2 NEGATIVE NEGATIVE Final    Comment: (NOTE) SARS-CoV-2 target nucleic acids are NOT DETECTED.  The SARS-CoV-2 RNA is generally detectable in upper and lower respiratory specimens during the acute phase of infection. The lowest concentration of SARS-CoV-2 viral copies this assay can detect is 250 copies / mL. A negative result does not preclude SARS-CoV-2 infection and should not be used as the sole basis for treatment or other patient management decisions.  A negative result may occur with improper specimen collection / handling, submission of specimen other than nasopharyngeal swab, presence of viral  mutation(s) within the areas targeted by this assay, and inadequate number of viral copies (<250 copies / mL). A negative result must be combined with clinical observations, patient history, and epidemiological information.  Fact Sheet for Patients:   StrictlyIdeas.no  Fact Sheet for Healthcare Providers: BankingDealers.co.za  This test is not yet approved or  cleared by the [place] FDA and has been authorized for detection and/or diagnosis of SARS-CoV-2 by FDA under an Emergency Use Authorization (EUA).  This EUA will remain in effect (meaning this test can be used) for the duration of the COVID-19 declaration under Section 564(b)(1) of the Act, 21 U.S.C. section 360bbb-3(b)(1), unless the authorization is terminated or revoked sooner.  Performed at [facility], [address] [place], [place] [address]          Radiology Studies: DG Abd 1 View  Result Date: 06/16/2020 CLINICAL DATA:  Small bowel obstruction, NG  tube, abdominal pain EXAM: ABDOMEN - 1 VIEW COMPARISON:  06/15/2020 abdominal radiograph FINDINGS: Enteric tube tip in the gastric fundus. Surgical clips overlie central abdomen. Multiple dilated small bowel loops in the left and central abdomen up to the 4.1 cm diameter, not substantially changed. Mild colonic stool. No evidence of pneumatosis or pneumoperitoneum. No radiopaque nephrolithiasis. IMPRESSION: 1. Enteric tube tip in the gastric fundus. 2. Stable findings of mid to distal small bowel obstruction. Electronically Signed   By: Ilona Sorrel M.D.   On: 06/16/2020 09:55        Scheduled Meds: . enoxaparin (LOVENOX) injection  40 mg Subcutaneous Q24H  . feeding supplement (NEPRO CARB STEADY)  237 mL Oral BID BM  . insulin aspart  0-20 Units Subcutaneous Q4H  . insulin glargine  10 Units Subcutaneous Daily  . pantoprazole (PROTONIX) IV  40 mg Intravenous Q12H  . polyethylene glycol  17 g Oral  Daily   Continuous Infusions:   LOS: 9 days    Time spent: 25 minutes    Sharen Hones, MD [facility]   To contact the attending provider between 7A-7P or the covering provider during after hours 7P-7A, please log into the web site [email or web address] and access using universal [place] password for that web site. If you do not have the password, please call the hospital operator.  06/17/2020, 11:52 AM

## 2020-06-17 NOTE — Progress Notes (Addendum)
[facility] SURGICAL PROGRESS NOTE (cpt [phone])  Hospital Day(s): 9.   Interval History: Patient seen and examined, no acute events or new complaints overnight. Patient reports she is feeling better, denies nausea, emesis, fever, chills. Labs are fairly reassuring as s he is without leukocytosis, renal function remains normal, and she has no electrolyte derangement aside from hyperphosphatemia to 5.1. She was able to pass NGT clamping trial yesterday and started on CLD. She has had multiple BMs in the last 24 hours and continues to pass flatus.   Review of Systems:  Constitutional: denies fever, chills  HEENT: denies cough or congestion  Respiratory: denies any shortness of breath  Cardiovascular: denies chest pain or palpitations  Gastrointestinal: denies abdominal pain, N/V, or diarrhea/and bowel function as per interval history Genitourinary: denies burning with urination or urinary frequency Musculoskeletal: denies pain, decreased motor or sensation  Vital signs in last 24 hours: [min-max] current  Temp:  [97.7 F (36.5 C)-98.1 F (36.7 C)] 97.7 F (36.5 C) (08/25 0426) Pulse Rate:  [54-57] 57 (08/25 0426) Resp:  [16-20] 16 (08/25 0426) BP: (110-125)/(68-79) 110/69 (08/25 0426) SpO2:  [95 %-99 %] 99 % (08/25 0426)     Height: 5\' 3"  (160 cm) Weight: 72.3 kg BMI (Calculated): 28.24   Intake/Output last 2 shifts:  08/24 0701 - 08/25 0700 In: 0  Out: 230 [Emesis/NG output:230]   Physical Exam:  Constitutional: alert, cooperative and no distress  HENT: normocephalic without obvious abnormality Respiratory: breathing non-labored at rest  Cardiovascular: regular rate and sinus rhythm  Gastrointestinal: Soft, tenderness significantly improved, and non-distended, no rebound/guarding. Multiple surgical scars present consistent with previous surgeries.  Musculoskeletal: no edema or wounds, motor and sensation grossly intact, NT     Labs:  CBC Latest Ref Rng & Units  06/17/2020 06/16/2020 06/15/2020  WBC 4.0 - 10.5 K/uL 10.1 9.5 9.8  Hemoglobin 12.0 - 15.0 g/dL 13.2 12.8 12.5  Hematocrit 36 - 46 % 37.2 36.5 35.5(L)  Platelets 150 - 400 K/uL 276 260 291   CMP Latest Ref Rng & Units 06/17/2020 06/16/2020 06/15/2020  Glucose 70 - 99 mg/dL 134(H) 99 134(H)  BUN 6 - 20 mg/dL 7 8 7   Creatinine 0.44 - 1.00 mg/dL 0.67 0.64 0.75  Sodium 135 - 145 mmol/L 140 142 140  Potassium 3.5 - 5.1 mmol/L 3.6 4.3 3.8  Chloride 98 - 111 mmol/L 108 106 106  CO2 22 - 32 mmol/L 25 26 23   Calcium 8.9 - 10.3 mg/dL 8.3(L) 8.4(L) 8.4(L)  Total Protein 6.5 - 8.1 g/dL - - -  Total Bilirubin 0.3 - 1.2 mg/dL - - -  Alkaline Phos 38 - 126 U/L - - -  AST 15 - 41 U/L - - -  ALT 0 - 44 U/L - - -     Imaging studies: No new pertinent imaging studies   Assessment/Plan: (ICD-10's: K4.609) 39 y.o. 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, complicated by multipe pertinent comorbidities including poorly controlled DM   - Will advance to full liquids; ADAT slowly  - IVF resuscitation; begin to wean from this   - Monitor abdominal examination; on-going bowel function              - Pain control prn; antiemetics prn              - No emergent surgical intervention; she understands if she fails conservative management she would require intervention however given her extensive surgical history  she is at increased risk for complication from this and I would hope to avoid if possible             - mobilization encouraged; she needs to ambulate in halls             - Further management per primary team; we will follow    All of the above findings and recommendations were discussed with the patient, patient's family at bedside, and the medical team, and all of their questions were answered to their expressed satisfaction.  -- Edison Simon, PA-C [facility] 06/17/2020, 7:11 AM [phone] M-F: 7am -  4pm   I saw and evaluated the patient.  I agree with the above documentation, exam, and plan, which I have edited where appropriate. Fredirick Maudlin  9:45 AM

## 2020-06-18 LAB — BASIC METABOLIC PANEL
Anion gap: 10 (ref 5–15)
BUN: <5 mg/dL — ABNORMAL LOW (ref 6–20)
CO2: 22 mmol/L (ref 22–32)
Calcium: 8.2 mg/dL — ABNORMAL LOW (ref 8.9–10.3)
Chloride: 108 mmol/L (ref 98–111)
Creatinine, Ser: 0.72 mg/dL (ref 0.44–1.00)
GFR calc Af Amer: >60 mL/min (ref >60)
GFR calc non Af Amer: >60 mL/min (ref >60)
Glucose, Bld: 118 mg/dL — ABNORMAL HIGH (ref 70–99)
Potassium: 3.6 mmol/L (ref 3.5–5.1)
Sodium: 140 mmol/L (ref 135–145)

## 2020-06-18 LAB — MAGNESIUM: Magnesium: 2.2 mg/dL (ref 1.7–2.4)

## 2020-06-18 LAB — GLUCOSE, CAPILLARY: Glucose-Capillary: 127 mg/dL — ABNORMAL HIGH (ref 70–99)

## 2020-06-18 LAB — PHOSPHORUS: Phosphorus: 4.8 mg/dL — ABNORMAL HIGH (ref 2.5–4.6)

## 2020-06-18 MED ORDER — INSULIN NPH ISOPHANE & REGULAR (70-30) 100 UNIT/ML ~~LOC~~ SUSP
15.0000 [IU] | Freq: Two times a day (BID) | SUBCUTANEOUS | 0 refills | Status: DC
Start: 2020-06-18 — End: 2022-04-11

## 2020-06-18 NOTE — Discharge Summary (Signed)
Physician Discharge Summary  Patient ID: Erica Bush MRN: [id number] DOB/AGE: 1981/06/01 39 y.o.  Admit date: 06/08/2020 Discharge date: 06/18/2020  Admission Diagnoses:  Discharge Diagnoses:  Active Problems:   SBO (small bowel obstruction) ([place])   Uncontrolled type 2 diabetes mellitus with hyperglycemia [facility])   Discharged Condition: good  Hospital Course:  JanetCarrerasis a3 y.o.femalewith a known history oftype 2 diabetes mellitus and endometrial cancer status post hysterectomy and status post appendectomy for ruptured appendix in the pastand partial colon resection, who presented to the emergency room with acute onset of epigastric painextending to the left lower quadrantwith recurrent vomiting since this a.m. CT scan showed segmental dilation, small intestines consistent with small bowel obstruction. Patient has been seen by general surgery, started IV fluids and symptomatic treatment.  Patient is a followed by general surgery, apparently condition had improved today.  She is able to tolerate soft diet, abdominal pain has resolved.  She had a multiple bowel movement yesterday.  At this point she is medically stable to be discharged.  #1. small bowel obstruction secondary to adhesion. Patient had improved, patient will follow up with general surgery in 2 weeks.  #2.  Uncontrolled type 2 diabetes with hyperglycemia. I have changed patient home insulin (NPH) to twice a day.  Ask social work to arrange for primary care physician follow-up.  3.  Hypokalemia. Potassium has normalized.    Consults: general surgery  Significant Diagnostic Studies:   CT ABDOMEN AND PELVIS WITH CONTRAST  TECHNIQUE: Multidetector CT imaging of the abdomen and pelvis was performed using the standard protocol following bolus administration of intravenous contrast.  CONTRAST:  149mL OMNIPAQUE IOHEXOL 300 MG/ML  SOLN  COMPARISON:  Plain film of the abdomen acquired on  June 13, 2020 and CT of the abdomen and pelvis from June 08, 2020  FINDINGS: Lower chest: Interval development of a small RIGHT and trace LEFT effusion. Basilar volume loss. No pericardial fluid.  Hepatobiliary: Small amount of perihepatic fluid has developed in the interval since the prior study. No focal, suspicious hepatic lesion. Liver span remains enlarged and there are signs of hepatic steatosis with low-density hepatic parenchyma. Liver span approximately 21 cm greatest craniocaudal extent. Portal vein is patent.  Mild distension of the gallbladder with small amount of pericholecystic fluid, nonspecific in the setting of perihepatic fluid.  Pancreas: Pancreas is normal without ductal dilation or inflammation.  Spleen: Spleen is mildly enlarged approximately 12 cm greatest craniocaudal extent, no focal splenic lesion. Axial dimension of the spleen approximately 15 cm.  Adrenals/Urinary Tract: Adrenal glands are normal.  Symmetric renal enhancement. No hydronephrosis. Urinary bladder is normal.  Stomach/Bowel: Mild circumferential distal esophageal thickening. Stomach under distended limiting assessment. In the mid small bowel beginning with the proximal jejunum there is persistent small bowel dilation. There alternating loops of dilated and decompressed small bowel to the level of a transition in the mid abdomen on image 61 of series 2. There is gas and stool like material in the distal small bowel and there is stool and gas in the colon.  Evidence of prior partial colonic resection with anastomotic changes in the low abdomen. Colonic diverticulosis.  Vascular/Lymphatic: No aneurysmal dilation or significant atherosclerosis. The portal vein and SMV are patent. No adenopathy in the retroperitoneum.  Status post lower retroperitoneal lymphadenectomy and upper pelvic lymphadenectomy.  Reproductive: Post hysterectomy. Urinary bladder  moderately distended.  Other: Mild stranding in the midline of the abdominal wall, seen in the setting of generalized, mild-to-moderate body wall edema. No  focal fluid collection. Small fat containing ventral hernia similar to the prior study.  Musculoskeletal: No acute musculoskeletal process. Mild spinal degenerative changes.  IMPRESSION: 1. Persistent small bowel dilation with alternating loops of dilated and decompressed small bowel to the level of a transition in the mid abdomen. Findings may represent a partial small bowel obstruction. Ileus related to enteritis is also considered. 2. Interval development of a small RIGHT and trace LEFT effusion with associated basilar collapse/consolidative change. 3. Interval development of perihepatic ascites and small amount of pericholecystic fluid, findings are of uncertain significance. Correlate with liver enzymes and any signs of RIGHT upper quadrant pain. Findings are further confounded by the presence of developing anasarca with effusions and mild body wall edema. 4. Hepatosplenomegaly with signs of hepatic steatosis. Correlate with signs of liver disease which could also explain ascites. 5. Status post lower retroperitoneal lymphadenectomy and upper pelvic lymphadenectomy. 6. Aortic atherosclerosis.  Aortic Atherosclerosis (ICD10-I70.0).   Electronically Signed   By: Zetta Bills M.D.   On: 06/13/2020 15:47   Treatments: Supportive treatment.  Discharge Exam: Blood pressure (!) 98/56, pulse (!) 59, temperature 98.3 F (36.8 C), temperature source Oral, resp. rate 16, height 5\' 3"  (1.6 m), weight 72.3 kg, SpO2 95 %. General appearance: alert and cooperative Resp: clear to auscultation bilaterally Cardio: regular rate and rhythm, S1, S2 normal, no murmur, click, rub or gallop GI: soft, non-tender; bowel sounds normal; no masses,  no organomegaly Extremities: extremities normal, atraumatic, no cyanosis or  edema  Disposition: Discharge disposition: 01-Home or Self Care       Discharge Instructions    Diet - low sodium heart healthy   Complete by: As directed    Increase activity slowly   Complete by: As directed      Allergies as of 06/18/2020      Reactions   Ciprofloxacin Shortness Of Breath, Nausea And Vomiting   Shortness of breath   Dexamethasone Sodium Phosphate Anaphylaxis, Rash, Swelling      Medication List    TAKE these medications   insulin NPH-regular Human (70-30) 100 UNIT/ML injection Inject 15 Units into the skin 2 (two) times daily with a meal. What changed:   how much to take  when to take this   traMADol 50 MG tablet Commonly known as: ULTRAM Take 1 tablet (50 mg total) by mouth every 6 (six) hours as needed for severe pain.       Follow-up Information    [place], [place] F, MD Follow up in 2 week(s).   Specialty: General Surgery Contact information: [address] [place] [address] [phone]               Signed: Sharen Hones 06/18/2020, 10:48 AM

## 2020-06-18 NOTE — Progress Notes (Addendum)
[facility] SURGICAL PROGRESS NOTE (cpt [phone])  Hospital Day(s): 10.   Interval History: Patient seen and examined, no acute events or new complaints overnight. Patient sitting up in the bed anxious to go home. She denied any abdominal pain, distension, fever, chills, nausea, emesis. She has tolerated full liquids without issues and continues to pass flatus and have bowel function. Labs are reassuring aside from hyperphosphatemia. Otherwise no issues and she is anxious to go home.   Review of Systems:  Constitutional: denies fever, chills  HEENT: denies cough or congestion  Respiratory: denies any shortness of breath  Cardiovascular: denies chest pain or palpitations  Gastrointestinal: denies abdominal pain, N/V, or diarrhea/and bowel function as per interval history Genitourinary: denies burning with urination or urinary frequency Musculoskeletal: denies pain, decreased motor or sensation  Vital signs in last 24 hours: [min-max] current  Temp:  [98 F (36.7 C)-98.3 F (36.8 C)] 98.3 F (36.8 C) (08/26 0530) Pulse Rate:  [54-60] 59 (08/26 0530) Resp:  [16-20] 16 (08/26 0530) BP: (96-108)/(56-67) 98/56 (08/26 0530) SpO2:  [95 %-99 %] 95 % (08/26 0530)     Height: 5\' 3"  (160 cm) Weight: 72.3 kg BMI (Calculated): 28.24   Intake/Output last 2 shifts:  No intake/output data recorded.   Physical Exam:  Constitutional: alert, cooperative and no distress  HENT: normocephalic without obvious abnormality  Eyes: PERRL, EOM's grossly intact and symmetric  Respiratory: breathing non-labored at rest  Cardiovascular: regular rate and sinus rhythm  Gastrointestinal: soft, non-tender, and non-distended. No rebound/guarding. Multiple surgical scars present consistent with previous surgeries.  Musculoskeletal: no edema or wounds, motor and sensation grossly intact, NT    Labs:  CBC Latest Ref Rng & Units 06/17/2020 06/16/2020 06/15/2020  WBC 4.0 - 10.5 K/uL 10.1 9.5 9.8   Hemoglobin 12.0 - 15.0 g/dL 13.2 12.8 12.5  Hematocrit 36 - 46 % 37.2 36.5 35.5(L)  Platelets 150 - 400 K/uL 276 260 291   CMP Latest Ref Rng & Units 06/18/2020 06/17/2020 06/16/2020  Glucose 70 - 99 mg/dL 118(H) 134(H) 99  BUN 6 - 20 mg/dL <5(L) 7 8  Creatinine 0.44 - 1.00 mg/dL 0.72 0.67 0.64  Sodium 135 - 145 mmol/L 140 140 142  Potassium 3.5 - 5.1 mmol/L 3.6 3.6 4.3  Chloride 98 - 111 mmol/L 108 108 106  CO2 22 - 32 mmol/L 22 25 26   Calcium 8.9 - 10.3 mg/dL 8.2(L) 8.3(L) 8.4(L)  Total Protein 6.5 - 8.1 g/dL - - -  Total Bilirubin 0.3 - 1.2 mg/dL - - -  Alkaline Phos 38 - 126 U/L - - -  AST 15 - 41 U/L - - -  ALT 0 - 44 U/L - - -     Imaging studies: No new pertinent imaging studies   Assessment/Plan: (ICD-10's: K44.609) 39 y.o. 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 She understands that recurrence of bowel obstructions is unpredictable.    All of the above findings and recommendations were discussed with the patient, and the medical team, and all of patient's questions were answered to her expressed satisfaction.  -- Edison Simon, PA-C [facility] 06/18/2020, 8:10 AM [phone] M-F: 7am - 4pm  The patient was discharged prior to my rounding on her.  I concur with Mr. Gery Pray documentation, so far as I am able to verify without personally examining the patient.   Fredirick Maudlin  1:35 PM

## 2020-06-18 NOTE — Progress Notes (Signed)
IV removed from patient. Discharge instructions given to patient. Verbalized understanding. No acute distress at this time. Husband is at bedside and will transport patient home. Patient requested to eat lunch before. Educated to call for assistance when ready for wheelchair for discharge.

## 2020-06-18 NOTE — TOC Transition Note (Signed)
Transition of Care [facility]) - CM/SW Discharge Note   Patient Details  Name: Erica Bush MRN: [id number] Date of Birth: 1981-03-10  [facility]) CM/SW Contact:  Candie Chroman, LCSW Phone Number: 06/18/2020, 11:43 AM   Clinical Narrative:  Patient has orders to discharge home today. Blood glucose kit provided. Patient has the free/lost-cost healthcare in [facility] booklet at home that was provided last week. CSW offered to make her an appointment at one of the [facility] but patient prefers to call on her own. CSW notified her that [facility] closest to her home, [facility] is second closest. No further concerns. CSW signing off.   Final next level of care: Home/Self Care Barriers to Discharge: Barriers Resolved   Patient Goals and CMS Choice        Discharge Placement                  Name of family member notified: Viveka Wilmeth Patient and family notified of of transfer: 06/18/20  Discharge Plan and Services                                     Social Determinants of Health (SDOH) Interventions     Readmission Risk Interventions No flowsheet data found.

## 2020-06-19 ENCOUNTER — Telehealth: Payer: Self-pay | Admitting: Surgery

## 2020-06-19 NOTE — Telephone Encounter (Signed)
Patient has never been seen in our office. Per Dr Celine Ahr we cannot give patient excuse note, she was suppose to obtain this note from hospital prior to being discharged since she was on hospitalist service. Per Dr Celine Ahr patient does not need f/u. F/u appt with Dr Dahlia Byes cancelled. Patient advised she needs to get settled with PCP to get better control on diabetes.  Patient states she spoke to Nurse that discharged her name Maylon Cos. He told her that they could not give her note and she needed to contact our office. Advised patient Dr Celine Ahr will not write note. Patient states she will contact employer and call the office back.

## 2020-06-19 NOTE — Telephone Encounter (Signed)
Incoming call from the patient.  She needs a letter stating that she can return to work on Monday August 30th.  She needs her income and can't afford to be out of work.  Please call her back.  Looks like she was in the ED just recently and sees Dr. Dahlia Byes here in the office on 07/06/20.  Thank you.

## 2020-07-06 ENCOUNTER — Inpatient Hospital Stay: Payer: Self-pay | Admitting: Surgery

## 2020-08-12 ENCOUNTER — Ambulatory Visit: Payer: Self-pay | Attending: Oncology

## 2020-08-22 IMAGING — CT CT RENAL STONE PROTOCOL
3 of 4 series · 10 of 46 positions shown, 17 images · non-contrast
Comparison: CT abdomen pelvis 04/15/2018.

CLINICAL DATA: Patient with bilateral flank pain.

EXAM:
CT ABDOMEN AND PELVIS WITHOUT CONTRAST
TECHNIQUE: Multidetector CT imaging of the abdomen and pelvis was performed
following the standard protocol without IV contrast.

[Series 4: lung bases · axial · 0.76mm/px · z∈[-156,-51]mm · 6 of 31 slices shown, 11 images]
[im 5/31  soft-tissue]
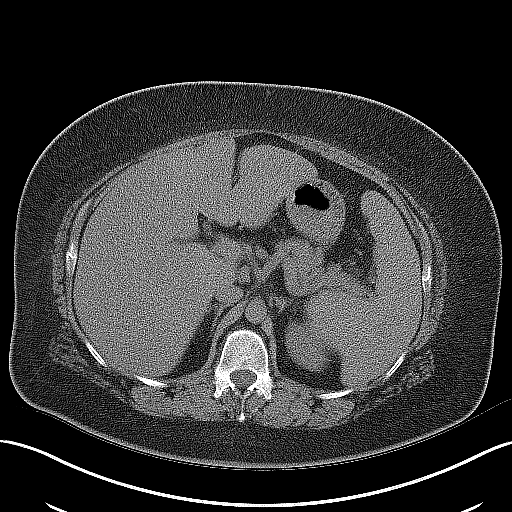
[im 5/31  bone]
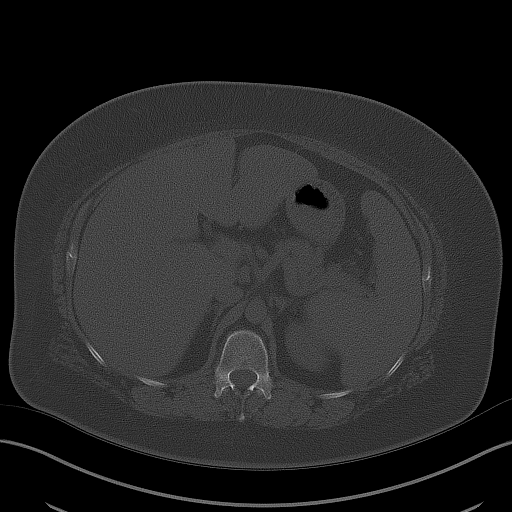
[im 9/31  soft-tissue]
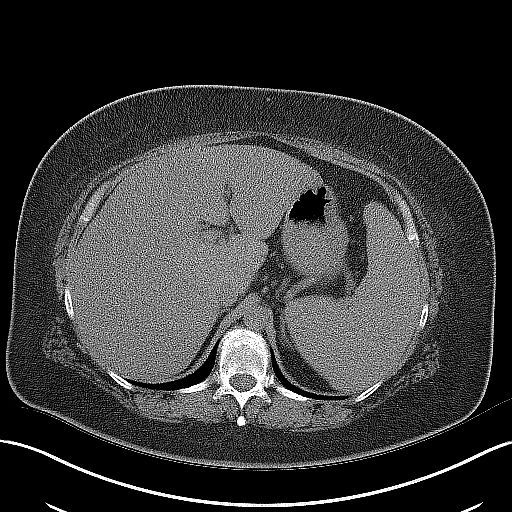
[im 13/31  soft-tissue]
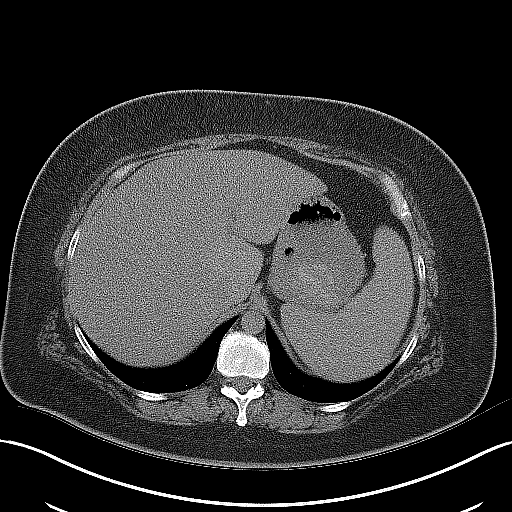
[im 13/31  lung]
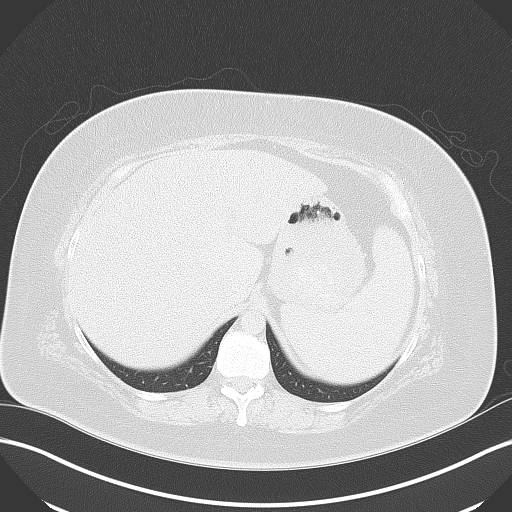
[im 18/31  soft-tissue]
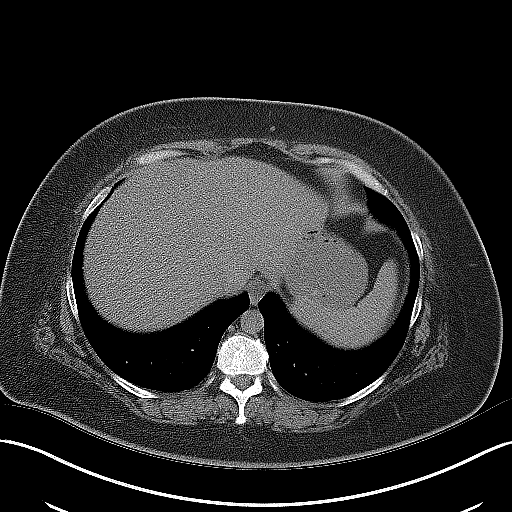
[im 18/31  lung]
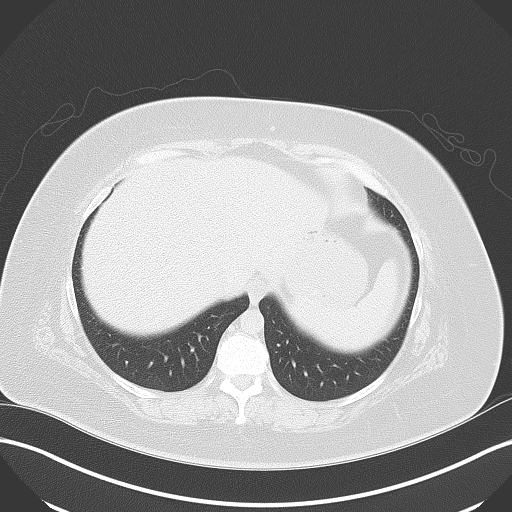
[im 22/31  soft-tissue]
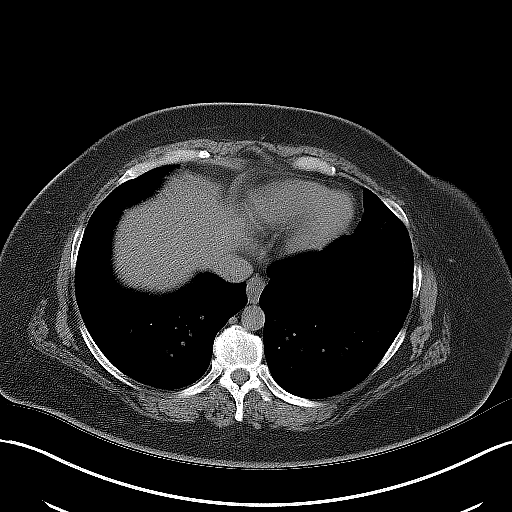
[im 22/31  lung]
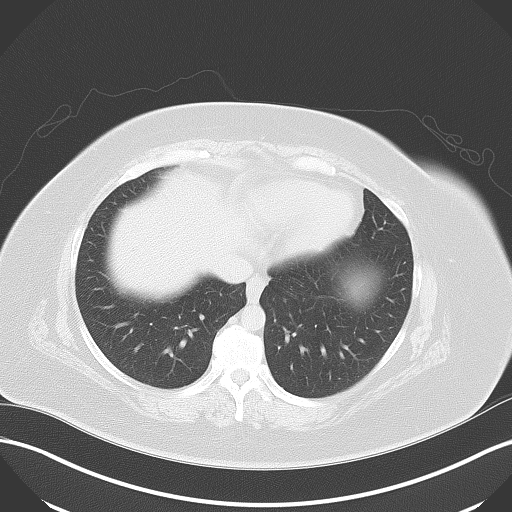
[im 26/31  soft-tissue]
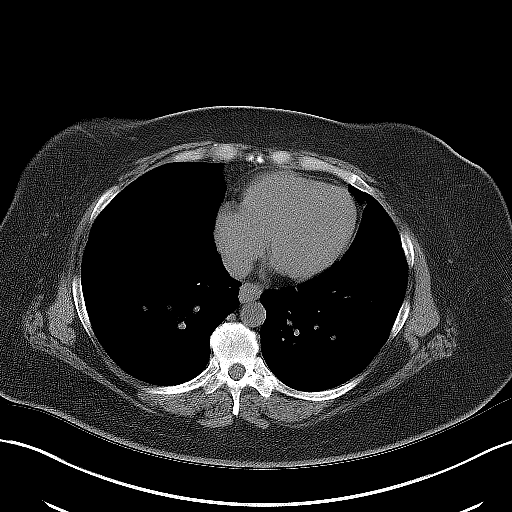
[im 26/31  lung]
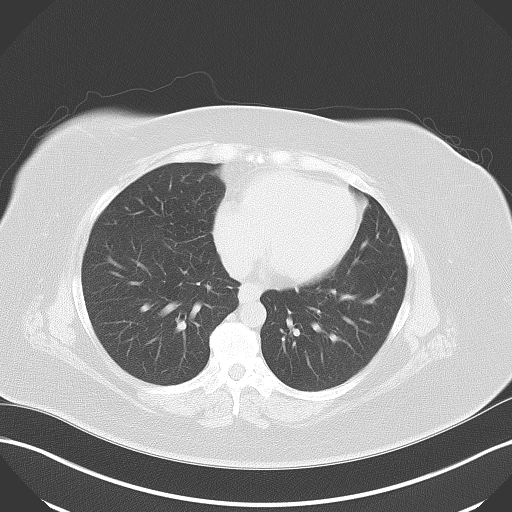

[Series 5: coronal · coronal · 0.78mm/px · 3 of 134 slices shown, 4 images]
[im 45/134  soft-tissue]
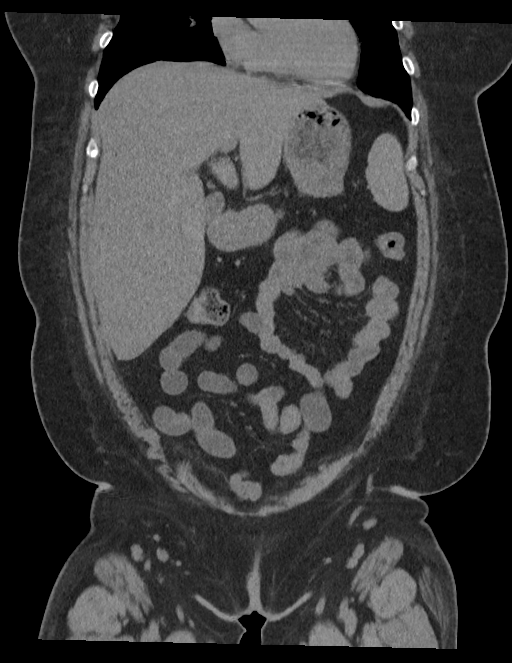
[im 60/134  soft-tissue]
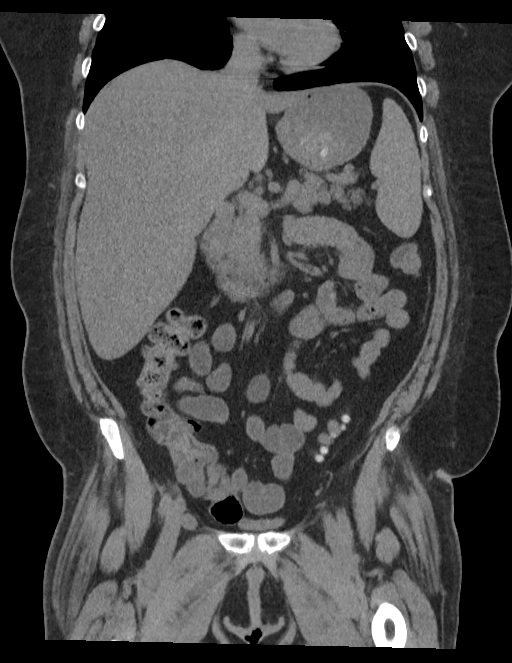
[im 60/134  bone]
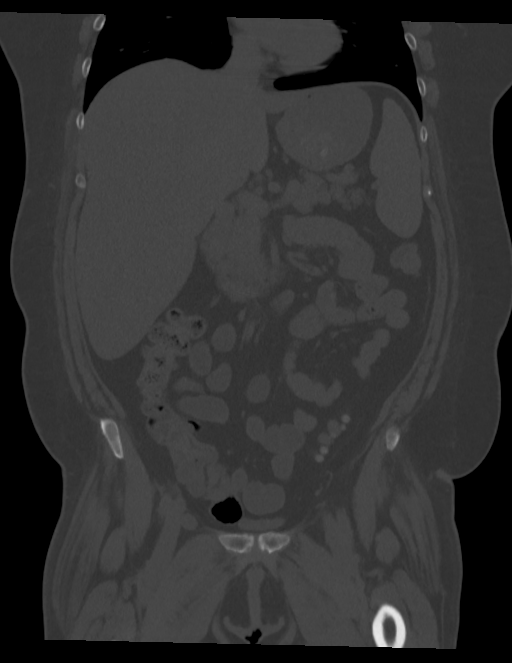
[im 74/134  soft-tissue]
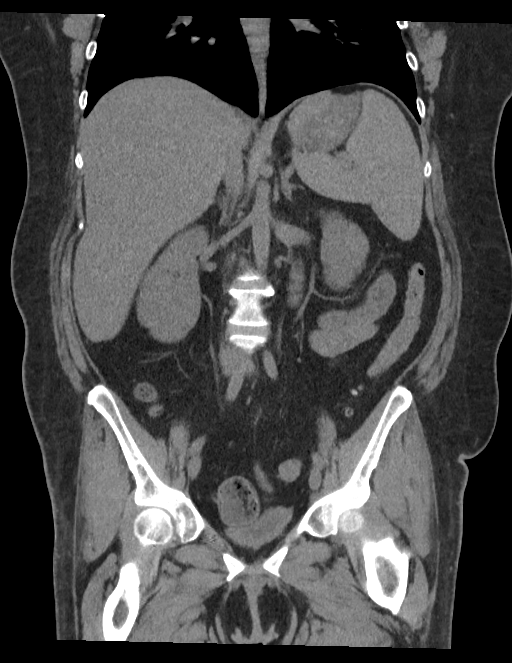

[Series 6: sagittal · sagittal · 0.64mm/px · 1 of 170 slices shown, 2 images]
[im 57/170  soft-tissue]
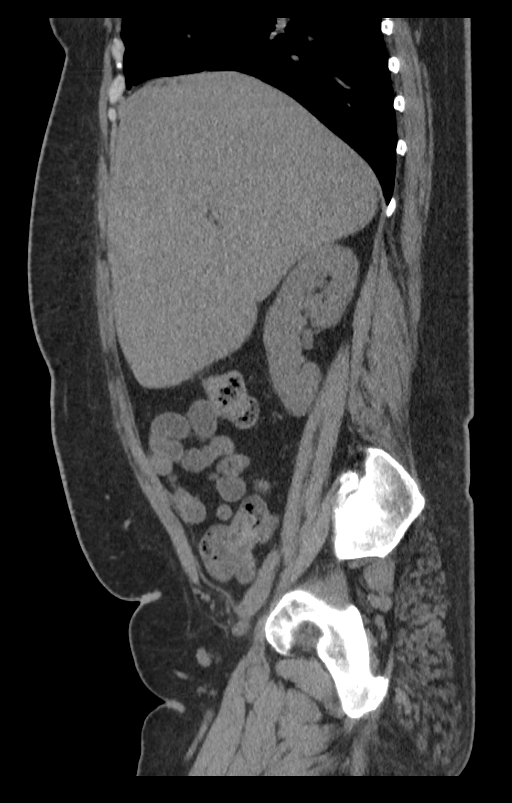
[im 57/170  bone]
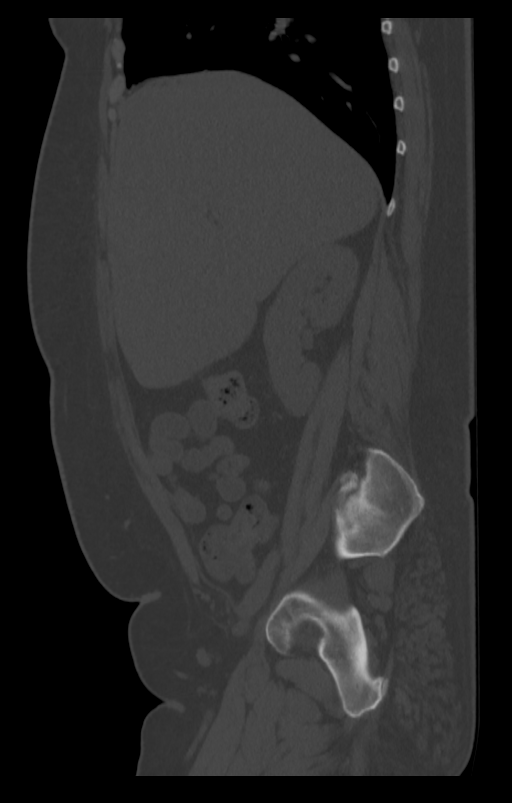

[10 of 46 positions shown; findings below may reference images not displayed]

FINDINGS: Lower chest: Normal heart size. Lung bases are clear. No pleural
effusion.

Hepatobiliary: Liver is diffusely low in attenuation compatible with
steatosis. Gallbladder is unremarkable. No intrahepatic or
extrahepatic biliary ductal dilatation.

Pancreas: Unremarkable

Spleen: Enlarged measuring 15 cm.

Adrenals/Urinary Tract: Normal adrenal glands. Kidneys are symmetric
in size. No hydronephrosis. No nephroureterolithiasis. No
hydronephrosis. Urinary bladder is unremarkable.

Stomach/Bowel: Stable postsurgical changes involving the sigmoid
colon descending colonic diverticulosis without evidence for acute
diverticulitis. Normal morphology of the stomach. No evidence for
small bowel obstruction. No free fluid or free intraperitoneal air.

Vascular/Lymphatic: Normal caliber abdominal aorta. Retroperitoneal
surgical clips. No retroperitoneal adenopathy.

Reproductive: Status post hysterectomy.

Other: Small bilateral fat containing inguinal hernias.

Musculoskeletal: Lumbar spine degenerative changes. Lower thoracic
spine degenerative changes. No aggressive or acute appearing osseous
lesions.
IMPRESSION: No acute process within the abdomen or pelvis.

Hepatic steatosis

Mild splenomegaly.

## 2021-11-10 ENCOUNTER — Emergency Department: Payer: Self-pay

## 2021-11-10 ENCOUNTER — Other Ambulatory Visit: Payer: Self-pay

## 2021-11-10 ENCOUNTER — Emergency Department
Admission: EM | Admit: 2021-11-10 | Discharge: 2021-11-10 | Disposition: A | Discharge status: Home / Self Care | Payer: Self-pay | Attending: Emergency Medicine | Admitting: Emergency Medicine

## 2021-11-10 DIAGNOSIS — E1165 Type 2 diabetes mellitus with hyperglycemia: Secondary | ICD-10-CM | POA: Insufficient documentation

## 2021-11-10 DIAGNOSIS — Z20822 Contact with and (suspected) exposure to covid-19: Secondary | ICD-10-CM | POA: Insufficient documentation

## 2021-11-10 DIAGNOSIS — R739 Hyperglycemia, unspecified: Secondary | ICD-10-CM

## 2021-11-10 DIAGNOSIS — R0789 Other chest pain: Secondary | ICD-10-CM | POA: Insufficient documentation

## 2021-11-10 DIAGNOSIS — R251 Tremor, unspecified: Secondary | ICD-10-CM | POA: Insufficient documentation

## 2021-11-10 LAB — BASIC METABOLIC PANEL
Anion gap: 11 (ref 5–15)
BUN: 9 mg/dL (ref 6–20)
CO2: 21 mmol/L — ABNORMAL LOW (ref 22–32)
Calcium: 9 mg/dL (ref 8.9–10.3)
Chloride: 97 mmol/L — ABNORMAL LOW (ref 98–111)
Creatinine, Ser: 0.62 mg/dL (ref 0.44–1.00)
GFR, Estimated: >60 mL/min (ref >60)
Glucose, Bld: 366 mg/dL — ABNORMAL HIGH (ref 70–99)
Potassium: 3.8 mmol/L (ref 3.5–5.1)
Sodium: 129 mmol/L — ABNORMAL LOW (ref 135–145)

## 2021-11-10 LAB — CBC
HCT: 41.2 % (ref 36.0–46.0)
Hemoglobin: 15 g/dL (ref 12.0–15.0)
MCH: 31.8 pg (ref 26.0–34.0)
MCHC: 36.4 g/dL — ABNORMAL HIGH (ref 30.0–36.0)
MCV: 87.5 fL (ref 80.0–100.0)
Platelets: 289 10*3/uL (ref 150–400)
RBC: 4.71 MIL/uL (ref 3.87–5.11)
RDW: 11.6 % (ref 11.5–15.5)
WBC: 9.8 10*3/uL (ref 4.0–10.5)
nRBC: 0 % (ref 0.0–0.2)

## 2021-11-10 LAB — URINALYSIS, ROUTINE W REFLEX MICROSCOPIC
Bacteria, UA: NONE SEEN
Bilirubin Urine: NEGATIVE
Glucose, UA: >=500 mg/dL — AB
Hgb urine dipstick: NEGATIVE
Ketones, ur: 5 mg/dL — AB
Nitrite: NEGATIVE
Protein, ur: NEGATIVE mg/dL
Specific Gravity, Urine: 1.021 (ref 1.005–1.030)
pH: 5 (ref 5.0–8.0)

## 2021-11-10 LAB — RESP PANEL BY RT-PCR (FLU A&B, COVID) ARPGX2
Influenza A by PCR: NEGATIVE
Influenza B by PCR: NEGATIVE
SARS Coronavirus 2 by RT PCR: NEGATIVE

## 2021-11-10 LAB — PREGNANCY, URINE: Preg Test, Ur: NEGATIVE

## 2021-11-10 LAB — D-DIMER, QUANTITATIVE: D-Dimer, Quant: <0.27 ug/mL-FEU (ref 0.00–0.50)

## 2021-11-10 LAB — CBG MONITORING, ED
Glucose-Capillary: 373 mg/dL — ABNORMAL HIGH (ref 70–99)
Glucose-Capillary: 358 mg/dL — ABNORMAL HIGH (ref 70–99)

## 2021-11-10 LAB — TROPONIN I (HIGH SENSITIVITY): Troponin I (High Sensitivity): <2 ng/L (ref <18)

## 2021-11-10 MED ORDER — INSULIN ASPART 100 UNIT/ML IJ SOLN
10.0000 [IU] | Freq: Once | INTRAMUSCULAR | Status: AC
  Administered 2021-11-10: 10 [IU] via INTRAVENOUS
  Filled 2021-11-10: qty 1

## 2021-11-10 NOTE — ED Provider Notes (Signed)
[facility] Provider Note    Event Date/Time   First MD Initiated Contact with Patient 11/10/21 1224     (approximate)   History   Chest Pain   HPI  Tally Mattox is a 41 y.o. female with a history of diabetes on insulin presents to the emergency department for treatment and evaluation of left side chest pain that started yesterday evening.  Pain increases with movement and deep breathing.  Pain is intermittent.  She was at work when the pain started.  She does not believe that she injured herself.  This morning, she states that she developed a heavy feeling in her right arm and if she makes a tight fist in the right hand, her hand shakes.  She states that this is never happened to her before.     Physical Exam   Triage Vital Signs: ED Triage Vitals  Enc Vitals Group     BP 11/10/21 1146 (!) 143/85     Pulse Rate 11/10/21 1146 (!) 103     Resp 11/10/21 1146 18     Temp 11/10/21 1146 98 F (36.7 C)     Temp src --      SpO2 11/10/21 1146 98 %     Weight --      Height --      Head Circumference --      Peak Flow --      Pain Score 11/10/21 1147 0     Pain Loc --      Pain Edu? --      Excl. in [place]? --     Most recent vital signs: Vitals:   11/10/21 1305 11/10/21 1330  BP: 116/69 (!) 110/51  Pulse: 90 86  Resp: 11 20  Temp:    SpO2: 99% 100%     General: Awake, no distress.  CV:  Good peripheral perfusion.  Resp:  Normal effort.  Breath sounds are clear to auscultation. Abd:  No distention.  Soft, nontender. Other:  Neuro exam shows no extremity weakness, no decrease in sensory or motor function, pupils are equal round and reactive, no pronator drift, tongue protrudes midline, smile is equal.   ED Results / Procedures / Treatments   Labs (all labs ordered are listed, but only abnormal results are displayed) Labs Reviewed  BASIC METABOLIC PANEL - Abnormal; Notable for the following components:      Result Value    Sodium 129 (*)    Chloride 97 (*)    CO2 21 (*)    Glucose, Bld 366 (*)    All other components within normal limits  CBC - Abnormal; Notable for the following components:   MCHC 36.4 (*)    All other components within normal limits  URINALYSIS, ROUTINE W REFLEX MICROSCOPIC - Abnormal; Notable for the following components:   Color, Urine YELLOW (*)    APPearance HAZY (*)    Glucose, UA >=500 (*)    Ketones, ur 5 (*)    Leukocytes,Ua SMALL (*)    All other components within normal limits  CBG MONITORING, ED - Abnormal; Notable for the following components:   Glucose-Capillary 373 (*)    All other components within normal limits  RESP PANEL BY RT-PCR (FLU A&B, COVID) ARPGX2  D-DIMER, QUANTITATIVE  PREGNANCY, URINE  TROPONIN I (HIGH SENSITIVITY)     EKG  ED ECG REPORT I, Arleigh Odowd, FNP-BC personally viewed and interpreted this ECG.   Date: 11/10/2021  EKG Time:  1148  Rate: 106  Rhythm: sinus tachycardia  Axis: normal  Intervals:none  ST&T Change: no ST elevation    RADIOLOGY Chest x-ray reviewed by me.  No acute cardiopulmonary abnormality.  Radiology report consistent with the same.  Head CT negative for acute findings.  Image and radiology report reviewed by me.   PROCEDURES:  Critical Care performed: No  Procedures   MEDICATIONS ORDERED IN ED: Medications  insulin aspart (novoLOG) injection 10 Units (has no administration in time range)     IMPRESSION / MDM / ASSESSMENT AND PLAN / ED COURSE  I reviewed the triage vital signs and the nursing notes.                              Differential diagnosis includes, but is not limited to, CAD, CVA, PE, musculoskeletal strain.  41 year old female presenting to the emergency department for treatment and evaluation of chest pain and right arm heaviness.  Exam is overall reassuring.  Family history negative for cardiac events prior to age 26.  Patient has no personal cardiac history.  No history of PE or  DVT.  Labs show a glucose of 366 with a normal anion gap on a relative sodium of 129.  Negative troponin.  Negative D-dimer.  Urine shows glucosuria without any indication of acute cystitis.  COVID and influenza are both negative.  Patient states that she has not taken her insulin today.  10 units ordered.  All results were discussed with the patient who feels reassured.  She states that the heaviness sensation in her arm is much better.  She currently has no chest pain.  Admission for further work-up of chest pain and arm heaviness offered.  Patient declined at this time.  She is low risk and this seems reasonable.  We did discuss in detail diabetes management and she was strongly encouraged to [facility] and request an appointment.  She will be given contact information upon discharge.  Offered to send in a refill of insulin for her.  She states that she does not need refill that she is able to get the insulin from her father.  ER return precautions were discussed.  If any symptom changes or worsens, she is to return to the emergency department. She and her husband agree to this plan.     FINAL CLINICAL IMPRESSION(S) / ED DIAGNOSES   Final diagnoses:  Hyperglycemia  Chest wall pain  Tremor of right hand     Rx / DC Orders   ED Discharge Orders     None        Note:  This document was prepared using Dragon voice recognition software and may include unintentional dictation errors.   Victorino Dike, FNP 11/10/21 1421    Naaman Plummer, MD 11/10/21 1549

## 2021-11-10 NOTE — Discharge Instructions (Signed)
[facility] to request an intake appointment.  Return to the emergency department immediately for increase or change in chest pain, increase or change in weakness, or other symptoms of concern.

## 2021-11-10 NOTE — ED Triage Notes (Signed)
Pt comes with c/o CP that started last night the patient states it is left sided under rib cage. Pt states today her right arm started to feel heavy. Pt states she feels like she can't lift it and is now shaking.  Pt states she is diabetic and sugars normally run high.

## 2022-04-08 ENCOUNTER — Encounter: Payer: Self-pay | Admitting: Nurse Practitioner

## 2022-04-08 ENCOUNTER — Ambulatory Visit: Payer: 59 | Admitting: Nurse Practitioner

## 2022-04-08 VITALS — BP 110/72 | HR 89 | Temp 97.2°F | Ht 61.0 in | Wt 159.8 lb

## 2022-04-08 DIAGNOSIS — E1140 Type 2 diabetes mellitus with diabetic neuropathy, unspecified: Secondary | ICD-10-CM

## 2022-04-08 DIAGNOSIS — R202 Paresthesia of skin: Secondary | ICD-10-CM | POA: Insufficient documentation

## 2022-04-08 DIAGNOSIS — E041 Nontoxic single thyroid nodule: Secondary | ICD-10-CM | POA: Insufficient documentation

## 2022-04-08 DIAGNOSIS — E1165 Type 2 diabetes mellitus with hyperglycemia: Secondary | ICD-10-CM | POA: Diagnosis not present

## 2022-04-08 DIAGNOSIS — Z794 Long term (current) use of insulin: Secondary | ICD-10-CM

## 2022-04-08 LAB — LIPID PANEL
Cholesterol: 197 mg/dL (ref 0–200)
HDL: 31.9 mg/dL — ABNORMAL LOW (ref >39.00)
Total CHOL/HDL Ratio: 6
Triglycerides: 648 mg/dL — ABNORMAL HIGH (ref 0.0–149.0)

## 2022-04-08 LAB — CBC
HCT: 40.6 % (ref 36.0–46.0)
Hemoglobin: 14.1 g/dL (ref 12.0–15.0)
MCHC: 34.6 g/dL (ref 30.0–36.0)
MCV: 90.9 fl (ref 78.0–100.0)
Platelets: 273 10*3/uL (ref 150.0–400.0)
RBC: 4.47 Mil/uL (ref 3.87–5.11)
RDW: 12.5 % (ref 11.5–15.5)
WBC: 8.8 10*3/uL (ref 4.0–10.5)

## 2022-04-08 LAB — COMPREHENSIVE METABOLIC PANEL
ALT: 32 U/L (ref 0–35)
AST: 22 U/L (ref 0–37)
Albumin: 4 g/dL (ref 3.5–5.2)
Alkaline Phosphatase: 103 U/L (ref 39–117)
BUN: 10 mg/dL (ref 6–23)
CO2: 26 mEq/L (ref 19–32)
Calcium: 9.1 mg/dL (ref 8.4–10.5)
Chloride: 100 mEq/L (ref 96–112)
Creatinine, Ser: 0.57 mg/dL (ref 0.40–1.20)
GFR: 111.78 mL/min (ref >60.00)
Glucose, Bld: 271 mg/dL — ABNORMAL HIGH (ref 70–99)
Potassium: 3.8 mEq/L (ref 3.5–5.1)
Sodium: 136 mEq/L (ref 135–145)
Total Bilirubin: 0.8 mg/dL (ref 0.2–1.2)
Total Protein: 6.9 g/dL (ref 6.0–8.3)

## 2022-04-08 LAB — LDL CHOLESTEROL, DIRECT: Direct LDL: 88 mg/dL

## 2022-04-08 LAB — VITAMIN B12: Vitamin B-12: 294 pg/mL (ref 211–911)

## 2022-04-08 LAB — POCT GLYCOSYLATED HEMOGLOBIN (HGB A1C): Hemoglobin A1C: 10.7 % — AB (ref 4.0–5.6)

## 2022-04-08 LAB — TSH: TSH: 1.04 u[IU]/mL (ref 0.35–5.50)

## 2022-04-08 MED ORDER — GABAPENTIN 100 MG PO CAPS: 100.0000 mg | ORAL_CAPSULE | Freq: Three times a day (TID) | ORAL | 1 refills | Status: AC

## 2022-04-08 MED ORDER — BLOOD GLUCOSE METER KIT: PACK | 12 refills | Status: AC

## 2022-04-08 MED ORDER — GLIPIZIDE 5 MG PO TABS: 5.0000 mg | ORAL_TABLET | Freq: Two times a day (BID) | ORAL | 1 refills | Status: AC

## 2022-04-08 NOTE — Assessment & Plan Note (Signed)
Likely secondary to uncontrolled type 2 diabetes.  We will check basic labs inclusive of TSH and B12 along with electrolytes.  We will go ahead and start patient on gabapentin 100 mg 3 times daily for diabetic neuropathy.

## 2022-04-08 NOTE — Assessment & Plan Note (Addendum)
Patient has tried many medications in the past inclusive of metformin which she failed due to tolerance and diverticulitis.  Was placed on Lantus pen along with Humalog pen and did not tolerate those well.  Was switched to intermediate acting 70/30 insulin she gave herself once a day ranging from 15 to 35 units per patient's choice.  Currently uncontrolled we will discontinue the insulin and start patient on glipizide 5 mg twice daily pending other labs have patient follow-up in 1 month she will check her glucose twice daily once in the morning fasting and once before bedtime.  Having decreased sensation and painful forefeet we will start patient on gabapentin 100 mg 3 times daily.  Sedation precautions given.  Follow-up 1 month

## 2022-04-08 NOTE — Patient Instructions (Addendum)
Check your sugar twice a day every day. First thing in the morning and then before bed. Record them and bring them to the office. STOP using the insulin. Follow up with me in 1 month, sooner if you need me The gabapentin can make you sleepy so be careful.

## 2022-04-08 NOTE — Progress Notes (Signed)
New Patient Office Visit  Subjective    Patient ID: Erica Bush, female    DOB: [birth year]  Age: 41 y.o. MRN: [id number]  CC:  Chief Complaint  Patient presents with   Establish Care    Np est care both feet are numb extremely painful x1 year     HPI Erica Bush presents to establish care   DM2: states that she is taking the NPH at night 15-35 units. Checks sugar sometimes. Has tired metformin that exacerbated her diverticulitis. She has tried humalog and lantus pen. Then switched her to the 70/30. Has talked to a nutritionist in the past. Diet:1 meal and lot of snacking. Cherry coke but has cut down sugars Exercise. No exercise just walking at work  GYN: states that she did a mamogram last year. They cleared her in regards to having Pap smears since she is status post hysterectomy.  Feet: painful and numb. All the toes are numb. Altered sensation. She states that her dad had wounds and just had a right leg amputation. Open left metatarsal     Outpatient Encounter Medications as of 04/08/2022  Medication Sig   blood glucose meter kit and supplies Dispense based on patient and insurance preference. Check sugar 3 times daily. E11.9   gabapentin (NEURONTIN) 100 MG capsule Take 1 capsule (100 mg total) by mouth 3 (three) times daily.   glipiZIDE (GLUCOTROL) 5 MG tablet Take 1 tablet (5 mg total) by mouth 2 (two) times daily before a meal.   insulin NPH-regular Human (70-30) 100 UNIT/ML injection Inject 15 Units into the skin 2 (two) times daily with a meal.   traMADol (ULTRAM) 50 MG tablet Take 1 tablet (50 mg total) by mouth every 6 (six) hours as needed for severe pain. (Patient not taking: Reported on 06/08/2020)   No facility-administered encounter medications on file as of 04/08/2022.    Past Medical History:  Diagnosis Date   Cancer ([place])    Diabetes mellitus without complication ([place])    Diverticulitis    Endometrial cancer ([place])    age 39     Past Surgical History:  Procedure Laterality Date   ABDOMINAL HYSTERECTOMY      Family History  Problem Relation Age of Onset   Cancer Mother 55       cervical cancer   Hypertension Mother    Diabetes Mother    Hypertension Father    Heart disease Father    Diabetes Father    Diabetes Paternal Grandmother    Hypertension Paternal Grandmother    Hyperlipidemia Paternal Grandmother    Heart disease Paternal Grandmother     Social History   Socioeconomic History   Marital status: Married    Spouse name: Not on file   Number of children: Not on file   Years of education: Not on file   Highest education level: Not on file  Occupational History   Not on file  Tobacco Use   Smoking status: Former    Packs/day: 0.50    Years: 22.00    Total pack years: 11.00    Types: Cigarettes    Quit date: 12/05/2021    Years since quitting: 0.3   Smokeless tobacco: Never  Vaping Use   Vaping Use: Every day   Substances: Nicotine, Flavoring  Substance and Sexual Activity   Alcohol use: Not Currently   Drug use: Never   Sexual activity: Yes  Other Topics Concern   Not on file  Social History  Narrative   Fulltime: Passenger transport manager for [employer]   Social Determinants of Health   Financial Resource Strain: Not on file  Food Insecurity: Not on file  Transportation Needs: Not on file  Physical Activity: Not on file  Stress: Not on file  Social Connections: Not on file  Intimate Partner Violence: Not on file    Review of Systems  Constitutional:  Positive for malaise/fatigue. Negative for chills and fever.  Respiratory:  Negative for shortness of breath.   Cardiovascular:  Positive for leg swelling.  Genitourinary:  Positive for frequency.  Musculoskeletal:  Positive for joint pain.  Neurological:  Positive for tingling.        Objective    BP 110/72   Pulse 89   Temp (!) 97.2 F (36.2 C) (Temporal)   Ht '5\' 1"'  (1.549 m)   Wt 159 lb 12.8 oz (72.5 kg)   SpO2 98%    BMI 30.19 kg/m   Physical Exam Vitals and nursing note reviewed.  Constitutional:      Appearance: Normal appearance.  Neck:     Thyroid: Thyroid mass present. No thyromegaly or thyroid tenderness.  Cardiovascular:     Rate and Rhythm: Normal rate and regular rhythm.  Pulmonary:     Effort: Pulmonary effort is normal.     Breath sounds: Normal breath sounds.  Abdominal:     General: Bowel sounds are normal.  Musculoskeletal:     Right lower leg: No edema.     Left lower leg: No edema.  Lymphadenopathy:     Cervical: No cervical adenopathy.  Skin:    General: Skin is warm.  Neurological:     General: No focal deficit present.     Mental Status: She is alert.         Assessment & Plan:   Problem List Items Addressed This Visit       Endocrine   Uncontrolled type 2 diabetes mellitus with hyperglycemia ([place])   Relevant Medications   glipiZIDE (GLUCOTROL) 5 MG tablet   Type 2 diabetes mellitus with diabetic neuropathy, with long-term current use of insulin ([place]) - Primary    Patient has tried many medications in the past inclusive of metformin which she failed due to tolerance and diverticulitis.  Was placed on Lantus pen along with Humalog pen and did not tolerate those well.  Was switched to intermediate acting 70/30 insulin she gave herself once a day ranging from 15 to 35 units per patient's choice.  Currently uncontrolled we will discontinue the insulin and start patient on glipizide 5 mg twice daily pending other labs have patient follow-up in 1 month she will check her glucose twice daily once in the morning fasting and once before bedtime.  Having decreased sensation and painful forefeet we will start patient on gabapentin 100 mg 3 times daily.  Sedation precautions given.  Follow-up 1 month      Relevant Medications   glipiZIDE (GLUCOTROL) 5 MG tablet   gabapentin (NEURONTIN) 100 MG capsule   blood glucose meter kit and supplies   Other Relevant Orders   POCT  glycosylated hemoglobin (Hb A1C) (Completed)   CBC   Comprehensive metabolic panel   Lipid panel   Thyroid nodule    Historical diagnosis in 2005.  Patient states she did have an ultrasound that time that showed a benign nodule.  Palpable on the right side of thyroid in office today.  Patient states it has not grown nor does bother her.  Check TSH  today defer ultrasound for now        Other   Paresthesia    Likely secondary to uncontrolled type 2 diabetes.  We will check basic labs inclusive of TSH and B12 along with electrolytes.  We will go ahead and start patient on gabapentin 100 mg 3 times daily for diabetic neuropathy.      Relevant Orders   Vitamin B12   TSH    Return in about 4 weeks (around 05/06/2022) for DM recheck.   Romilda Garret, NP

## 2022-04-08 NOTE — Assessment & Plan Note (Signed)
Historical diagnosis in 2005.  Patient states she did have an ultrasound that time that showed a benign nodule.  Palpable on the right side of thyroid in office today.  Patient states it has not grown nor does bother her.  Check TSH today defer ultrasound for now

## 2022-04-11 ENCOUNTER — Other Ambulatory Visit: Payer: Self-pay | Admitting: Nurse Practitioner

## 2022-05-09 ENCOUNTER — Ambulatory Visit: Payer: 59 | Admitting: Nurse Practitioner

## 2022-05-10 ENCOUNTER — Ambulatory Visit: Payer: 59 | Admitting: Nurse Practitioner

## 2022-11-02 ENCOUNTER — Inpatient Hospital Stay: Payer: 59

## 2022-11-02 ENCOUNTER — Inpatient Hospital Stay: Payer: 59 | Attending: Oncology | Admitting: Oncology

## 2022-11-02 ENCOUNTER — Encounter: Payer: Self-pay | Admitting: Oncology

## 2022-11-02 VITALS — BP 104/77 | HR 85 | Temp 96.5°F | Resp 16 | Ht 61.0 in | Wt 158.0 lb

## 2022-11-02 DIAGNOSIS — E119 Type 2 diabetes mellitus without complications: Secondary | ICD-10-CM | POA: Insufficient documentation

## 2022-11-02 DIAGNOSIS — Z7984 Long term (current) use of oral hypoglycemic drugs: Secondary | ICD-10-CM | POA: Insufficient documentation

## 2022-11-02 DIAGNOSIS — Z90710 Acquired absence of both cervix and uterus: Secondary | ICD-10-CM | POA: Insufficient documentation

## 2022-11-02 DIAGNOSIS — R7989 Other specified abnormal findings of blood chemistry: Secondary | ICD-10-CM | POA: Insufficient documentation

## 2022-11-02 DIAGNOSIS — F17210 Nicotine dependence, cigarettes, uncomplicated: Secondary | ICD-10-CM | POA: Diagnosis not present

## 2022-11-02 DIAGNOSIS — Z79899 Other long term (current) drug therapy: Secondary | ICD-10-CM | POA: Insufficient documentation

## 2022-11-02 LAB — CBC
HCT: 39.3 % (ref 36.0–46.0)
Hemoglobin: 14.3 g/dL (ref 12.0–15.0)
MCH: 31.6 pg (ref 26.0–34.0)
MCHC: 36.4 g/dL — ABNORMAL HIGH (ref 30.0–36.0)
MCV: 86.8 fL (ref 80.0–100.0)
Platelets: 298 10*3/uL (ref 150–400)
RBC: 4.53 MIL/uL (ref 3.87–5.11)
RDW: 12.1 % (ref 11.5–15.5)
WBC: 9.1 10*3/uL (ref 4.0–10.5)
nRBC: 0 % (ref 0.0–0.2)

## 2022-11-02 LAB — FERRITIN: Ferritin: 272 ng/mL (ref 11–307)

## 2022-11-02 LAB — IRON AND TIBC
Iron: 113 ug/dL (ref 28–170)
Saturation Ratios: 28 % (ref 10.4–31.8)
TIBC: 412 ug/dL (ref 250–450)
UIBC: 299 ug/dL

## 2022-11-02 NOTE — Progress Notes (Signed)
[place]  [phone] [phone]  ID: Erica Bush OB: 01/11/1981  MR#: [id number]  ZTI#:[id number]  Patient Care Team: Michela Pitcher, NP as PCP - General (Nurse Practitioner) Rico Junker, RN as Registered Nurse Theodore Demark, RN (Inactive) as Registered Nurse  CHIEF COMPLAINT: Elevated ferritin.  INTERVAL HISTORY: Patient is a 42 year old female who was noted to have a mildly increased ferritin level on routine blood work.  She requested evaluation by hematology.  She currently feels well and is asymptomatic.  She had no neurologic complaints.  She denies any recent fevers or illnesses.  She has a good appetite and denies weight loss.  She has no chest pain, shortness of breath, cough, or hemoptysis.  She denies any nausea, vomiting, constipation, or diarrhea.  She has no urinary complaints.  Patient feels at her baseline offers no specific complaints today.  REVIEW OF SYSTEMS:   Review of Systems  Constitutional: Negative.  Negative for fever, malaise/fatigue and weight loss.  Respiratory: Negative.  Negative for cough, hemoptysis and shortness of breath.   Cardiovascular: Negative.  Negative for chest pain and leg swelling.  Gastrointestinal: Negative.  Negative for abdominal pain.  Genitourinary: Negative.  Negative for dysuria.  Musculoskeletal: Negative.  Negative for back pain.  Skin: Negative.  Negative for rash.  Neurological: Negative.  Negative for dizziness, focal weakness, weakness and headaches.  Psychiatric/Behavioral: Negative.  The patient is not nervous/anxious.     As per HPI. Otherwise, a complete review of systems is negative.  PAST MEDICAL HISTORY: Past Medical History:  Diagnosis Date   Cancer ([place])    Diabetes mellitus without complication ([place])    Diverticulitis    Endometrial cancer ([place])    age 55    PAST SURGICAL HISTORY: Past Surgical History:  Procedure Laterality Date   ABDOMINAL  HYSTERECTOMY      FAMILY HISTORY: Family History  Problem Relation Age of Onset   Cancer Mother 23       cervical cancer   Hypertension Mother    Diabetes Mother    Hypertension Father    Heart disease Father    Diabetes Father    Diabetes Paternal Grandmother    Hypertension Paternal Grandmother    Hyperlipidemia Paternal Grandmother    Heart disease Paternal Grandmother     ADVANCED DIRECTIVES (Y/N):  N  HEALTH MAINTENANCE: Social History   Tobacco Use   Smoking status: Former    Packs/day: 0.50    Years: 22.00    Total pack years: 11.00    Types: Cigarettes    Quit date: 12/05/2021    Years since quitting: 0.9   Smokeless tobacco: Never  Vaping Use   Vaping Use: Every day   Substances: Nicotine, Flavoring  Substance Use Topics   Alcohol use: Not Currently   Drug use: Never     Colonoscopy:  PAP:  Bone density:  Lipid panel:  Allergies  Allergen Reactions   Ciprofloxacin Shortness Of Breath and Nausea And Vomiting    Shortness of breath   Dexamethasone Sodium Phosphate Anaphylaxis, Rash and Swelling   Metformin Other (See Comments)    "Triggers diverticulitis"    Current Outpatient Medications  Medication Sig Dispense Refill   atorvastatin (LIPITOR) 40 MG tablet Take 40 mg by mouth daily.     blood glucose meter kit and supplies Dispense based on patient and insurance preference. Check sugar 3 times daily. E11.9 1 each 12   empagliflozin (JARDIANCE) 25 MG TABS tablet  Take 25 mg by mouth daily.     gabapentin (NEURONTIN) 100 MG capsule Take 1 capsule (100 mg total) by mouth 3 (three) times daily. 90 capsule 1   glipiZIDE (GLUCOTROL) 5 MG tablet Take 1 tablet (5 mg total) by mouth 2 (two) times daily before a meal. 60 tablet 1   traMADol (ULTRAM) 50 MG tablet Take 1 tablet (50 mg total) by mouth every 6 (six) hours as needed for severe pain. (Patient not taking: Reported on 06/08/2020) 30 tablet 0   No current facility-administered medications for this  visit.    OBJECTIVE: Vitals:   11/02/22 0952  BP: 104/77  Pulse: 85  Resp: 16  Temp: (!) 96.5 F (35.8 C)  SpO2: 100%     Body mass index is 29.85 kg/m.    ECOG FS:0 - Asymptomatic  General: Well-developed, well-nourished, no acute distress. Eyes: Pink conjunctiva, anicteric sclera. HEENT: Normocephalic, moist mucous membranes. Lungs: No audible wheezing or coughing. Heart: Regular rate and rhythm. Abdomen: Soft, nontender, no obvious distention. Musculoskeletal: No edema, cyanosis, or clubbing. Neuro: Alert, answering all questions appropriately. Cranial nerves grossly intact. Skin: No rashes or petechiae noted. Psych: Normal affect. Lymphatics: No cervical, calvicular, axillary or inguinal LAD.   LAB RESULTS:  Lab Results  Component Value Date   NA 136 04/08/2022   K 3.8 04/08/2022   CL 100 04/08/2022   CO2 26 04/08/2022   GLUCOSE 271 (H) 04/08/2022   BUN 10 04/08/2022   CREATININE 0.57 04/08/2022   CALCIUM 9.1 04/08/2022   PROT 6.9 04/08/2022   ALBUMIN 4.0 04/08/2022   AST 22 04/08/2022   ALT 32 04/08/2022   ALKPHOS 103 04/08/2022   BILITOT 0.8 04/08/2022   GFRNONAA >60 11/10/2021   GFRAA >60 06/18/2020    Lab Results  Component Value Date   WBC 9.1 11/02/2022   NEUTROABS 7.6 06/10/2020   HGB 14.3 11/02/2022   HCT 39.3 11/02/2022   MCV 86.8 11/02/2022   PLT 298 11/02/2022     STUDIES: No results found.  ASSESSMENT: Elevated ferritin level.  PLAN:    Elevated ferritin level: Patient's most recent ferritin level was only 314.  The remainder of her iron stores as well as hemoglobin are within normal limits.  Repeat result is pending at time of dictation.  Have also ordered a hemochromatosis mutation for completeness.  No intervention is needed.  Patient have video-assisted telemedicine visit in 3 weeks to discuss her laboratory results. History of endometrial cancer: Greater than 18 years ago.  No intervention needed.  I spent a total of 45  minutes reviewing chart data, face-to-face evaluation with the patient, counseling and coordination of care as detailed above.   Patient expressed understanding and was in agreement with this plan. She also understands that She can call clinic at any time with any questions, concerns, or complaints.     Lloyd Huger, MD   11/02/2022 12:13 PM

## 2022-11-07 LAB — HEMOCHROMATOSIS DNA-PCR(C282Y,H63D)

## 2022-11-16 ENCOUNTER — Other Ambulatory Visit: Payer: Self-pay | Admitting: Family Medicine

## 2022-11-16 DIAGNOSIS — N6311 Unspecified lump in the right breast, upper outer quadrant: Secondary | ICD-10-CM

## 2022-11-17 ENCOUNTER — Other Ambulatory Visit: Payer: 59

## 2022-11-24 ENCOUNTER — Inpatient Hospital Stay: Payer: 59 | Admitting: Oncology

## 2022-11-29 ENCOUNTER — Inpatient Hospital Stay: Admission: RE | Admit: 2022-11-29 | Payer: 59 | Source: Ambulatory Visit

## 2022-11-29 ENCOUNTER — Other Ambulatory Visit: Payer: 59

## 2022-12-06 ENCOUNTER — Inpatient Hospital Stay: Payer: 59 | Admitting: Oncology

## 2022-12-15 ENCOUNTER — Inpatient Hospital Stay: Payer: 59 | Attending: Oncology | Admitting: Oncology

## 2023-01-06 ENCOUNTER — Emergency Department: Payer: 59

## 2023-01-06 ENCOUNTER — Emergency Department
Admission: EM | Admit: 2023-01-06 | Discharge: 2023-01-06 | Disposition: A | Discharge status: Home / Self Care | Payer: 59 | Attending: Emergency Medicine | Admitting: Emergency Medicine

## 2023-01-06 ENCOUNTER — Other Ambulatory Visit: Payer: Self-pay

## 2023-01-06 ENCOUNTER — Encounter: Payer: Self-pay | Admitting: Emergency Medicine

## 2023-01-06 DIAGNOSIS — E119 Type 2 diabetes mellitus without complications: Secondary | ICD-10-CM | POA: Diagnosis not present

## 2023-01-06 DIAGNOSIS — Z8542 Personal history of malignant neoplasm of other parts of uterus: Secondary | ICD-10-CM | POA: Diagnosis not present

## 2023-01-06 DIAGNOSIS — R079 Chest pain, unspecified: Secondary | ICD-10-CM | POA: Diagnosis present

## 2023-01-06 LAB — CBC
HCT: 42.9 % (ref 36.0–46.0)
Hemoglobin: 15.2 g/dL — ABNORMAL HIGH (ref 12.0–15.0)
MCH: 31.7 pg (ref 26.0–34.0)
MCHC: 35.4 g/dL (ref 30.0–36.0)
MCV: 89.4 fL (ref 80.0–100.0)
Platelets: 278 10*3/uL (ref 150–400)
RBC: 4.8 MIL/uL (ref 3.87–5.11)
RDW: 11.7 % (ref 11.5–15.5)
WBC: 10.1 10*3/uL (ref 4.0–10.5)
nRBC: 0 % (ref 0.0–0.2)

## 2023-01-06 LAB — BASIC METABOLIC PANEL
Anion gap: 13 (ref 5–15)
BUN: 11 mg/dL (ref 6–20)
CO2: 23 mmol/L (ref 22–32)
Calcium: 9.6 mg/dL (ref 8.9–10.3)
Chloride: 103 mmol/L (ref 98–111)
Creatinine, Ser: 0.67 mg/dL (ref 0.44–1.00)
GFR, Estimated: >60 mL/min (ref >60)
Glucose, Bld: 264 mg/dL — ABNORMAL HIGH (ref 70–99)
Potassium: 3.9 mmol/L (ref 3.5–5.1)
Sodium: 139 mmol/L (ref 135–145)

## 2023-01-06 LAB — HEPATIC FUNCTION PANEL
ALT: 30 U/L (ref 0–44)
AST: 27 U/L (ref 15–41)
Albumin: 3.9 g/dL (ref 3.5–5.0)
Alkaline Phosphatase: 93 U/L (ref 38–126)
Bilirubin, Direct: 0.1 mg/dL (ref 0.0–0.2)
Indirect Bilirubin: 1 mg/dL — ABNORMAL HIGH (ref 0.3–0.9)
Total Bilirubin: 1.1 mg/dL (ref 0.3–1.2)
Total Protein: 7.5 g/dL (ref 6.5–8.1)

## 2023-01-06 LAB — TROPONIN I (HIGH SENSITIVITY): Troponin I (High Sensitivity): <2 ng/L (ref <18)

## 2023-01-06 LAB — LIPASE, BLOOD: Lipase: 44 U/L (ref 11–51)

## 2023-01-06 NOTE — ED Provider Notes (Signed)
[facility] Provider Note    Event Date/Time   First MD Initiated Contact with Patient 01/06/23 1333     (approximate)   History   Chest Pain   HPI  Erica Bush is a 42 y.o. female non-smoker with history of diabetes, diverticulitis, and endometrial cancer presents emergency department complaining of chest pain.  Patient states the chest pain comes in waves lasting about 5 to 10 seconds.  States increased today.  Symptoms started last night.  Does feel little lightheaded.  No shortness of breath, diaphoresis, radiation of pain.  Patient states she also had some diarrhea earlier today.  Some nausea.  Strong family history of cardiac disease.      Physical Exam   Triage Vital Signs: ED Triage Vitals  Enc Vitals Group     BP 01/06/23 1315 116/83     Pulse Rate 01/06/23 1315 95     Resp 01/06/23 1315 16     Temp 01/06/23 1315 97.7 F (36.5 C)     Temp Source 01/06/23 1315 Oral     SpO2 01/06/23 1315 97 %     Weight 01/06/23 1316 150 lb (68 kg)     Height 01/06/23 1316 5\' 2"  (1.575 m)     Head Circumference --      Peak Flow --      Pain Score 01/06/23 1315 7     Pain Loc --      Pain Edu? --      Excl. in [place]? --     Most recent vital signs: Vitals:   01/06/23 1315  BP: 116/83  Pulse: 95  Resp: 16  Temp: 97.7 F (36.5 C)  SpO2: 97%     General: Awake, no distress.   CV:  Good peripheral perfusion. regular rate and  rhythm Resp:  Normal effort. Lungs cta Abd:  No distention.  Nontender Other:     ED Results / Procedures / Treatments   Labs (all labs ordered are listed, but only abnormal results are displayed) Labs Reviewed  BASIC METABOLIC PANEL - Abnormal; Notable for the following components:      Result Value   Glucose, Bld 264 (*)    All other components within normal limits  CBC - Abnormal; Notable for the following components:   Hemoglobin 15.2 (*)    All other components within normal limits  HEPATIC  FUNCTION PANEL - Abnormal; Notable for the following components:   Indirect Bilirubin 1.0 (*)    All other components within normal limits  LIPASE, BLOOD  TROPONIN I (HIGH SENSITIVITY)     EKG  EKG   RADIOLOGY Chest x-ray    PROCEDURES:   Procedures   MEDICATIONS ORDERED IN ED: Medications - No data to display   IMPRESSION / MDM / [place] / ED COURSE  I reviewed the triage vital signs and the nursing notes.                              Differential diagnosis includes, but is not limited to, MI, angina, esophagitis, PE, chest wall pain  Patient's presentation is most consistent with acute presentation with potential threat to life or bodily function.   Due to the patient's history of diabetes along with family history of cardiac disease we will do cardiac workup,  Labs are reassuring  EKG is reassuring, normal sinus rhythm, no STEMI, see physician read  Chest x-ray  was independently reviewed interpreted by me as being negative for any acute abnormality.  Since the pain started last night do not feel we need to repeat the troponin.  I did explain everything to the patient.  Feel this is more of an angina type pain.  She should still follow-up with cardiology.  Ambulatory referral was placed in the computer for her to follow-up.  Also gave her the phone number.  Return emergency department immediately if worsening.  She is in agreement treatment plan.  Discharged stable condition.      FINAL CLINICAL IMPRESSION(S) / ED DIAGNOSES   Final diagnoses:  Nonspecific chest pain     Rx / DC Orders   ED Discharge Orders          Ordered    Ambulatory referral to Cardiology        01/06/23 1414             Note:  This document was prepared using Dragon voice recognition software and may include unintentional dictation errors.    Versie Starks, PA-C 01/06/23 1416    Lavonia Drafts, MD 01/06/23 [phone]

## 2023-01-06 NOTE — ED Triage Notes (Signed)
Pt reports intermittent left sided CP since yesterday. Denies cardiac hx. Describes pain as sharp and lasting about 5 seconds. Denies SOB. Nausea a few hours ago and some diarrhea.

## 2023-01-06 NOTE — Discharge Instructions (Signed)
Follow-up with cardiology.  Return emergency department worsening.  Continue your regular medications.

## 2023-01-31 ENCOUNTER — Ambulatory Visit
Admission: RE | Admit: 2023-01-31 | Discharge: 2023-01-31 | Disposition: A | Discharge status: Home / Self Care | Payer: 59 | Source: Ambulatory Visit | Attending: Family Medicine | Admitting: Family Medicine

## 2023-01-31 DIAGNOSIS — N6311 Unspecified lump in the right breast, upper outer quadrant: Secondary | ICD-10-CM

## 2023-08-17 ENCOUNTER — Emergency Department
Admission: EM | Admit: 2023-08-17 | Discharge: 2023-08-17 | Disposition: A | Discharge status: Home / Self Care | Payer: Self-pay | Attending: Emergency Medicine | Admitting: Emergency Medicine

## 2023-08-17 ENCOUNTER — Other Ambulatory Visit: Payer: Self-pay

## 2023-08-17 ENCOUNTER — Emergency Department: Payer: Self-pay

## 2023-08-17 DIAGNOSIS — R112 Nausea with vomiting, unspecified: Secondary | ICD-10-CM

## 2023-08-17 DIAGNOSIS — K529 Noninfective gastroenteritis and colitis, unspecified: Secondary | ICD-10-CM | POA: Insufficient documentation

## 2023-08-17 DIAGNOSIS — E119 Type 2 diabetes mellitus without complications: Secondary | ICD-10-CM | POA: Insufficient documentation

## 2023-08-17 LAB — COMPREHENSIVE METABOLIC PANEL
ALT: 44 U/L (ref 0–44)
AST: 30 U/L (ref 15–41)
Albumin: 4.1 g/dL (ref 3.5–5.0)
Alkaline Phosphatase: 106 U/L (ref 38–126)
Anion gap: 12 (ref 5–15)
BUN: 15 mg/dL (ref 6–20)
CO2: 23 mmol/L (ref 22–32)
Calcium: 9.1 mg/dL (ref 8.9–10.3)
Chloride: 100 mmol/L (ref 98–111)
Creatinine, Ser: 0.64 mg/dL (ref 0.44–1.00)
GFR, Estimated: >60 mL/min (ref >60)
Glucose, Bld: 247 mg/dL — ABNORMAL HIGH (ref 70–99)
Potassium: 3.5 mmol/L (ref 3.5–5.1)
Sodium: 135 mmol/L (ref 135–145)
Total Bilirubin: 1.5 mg/dL — ABNORMAL HIGH (ref 0.3–1.2)
Total Protein: 7.9 g/dL (ref 6.5–8.1)

## 2023-08-17 LAB — URINALYSIS, ROUTINE W REFLEX MICROSCOPIC
Bacteria, UA: NONE SEEN
Bilirubin Urine: NEGATIVE
Glucose, UA: >=500 mg/dL — AB
Hgb urine dipstick: NEGATIVE
Ketones, ur: 5 mg/dL — AB
Leukocytes,Ua: NEGATIVE
Nitrite: NEGATIVE
Protein, ur: NEGATIVE mg/dL
Specific Gravity, Urine: 1.035 — ABNORMAL HIGH (ref 1.005–1.030)
pH: 5 (ref 5.0–8.0)

## 2023-08-17 LAB — CBC
HCT: 44.6 % (ref 36.0–46.0)
Hemoglobin: 16 g/dL — ABNORMAL HIGH (ref 12.0–15.0)
MCH: 31.7 pg (ref 26.0–34.0)
MCHC: 35.9 g/dL (ref 30.0–36.0)
MCV: 88.3 fL (ref 80.0–100.0)
Platelets: 304 10*3/uL (ref 150–400)
RBC: 5.05 MIL/uL (ref 3.87–5.11)
RDW: 11.9 % (ref 11.5–15.5)
WBC: 8.3 10*3/uL (ref 4.0–10.5)
nRBC: 0 % (ref 0.0–0.2)

## 2023-08-17 LAB — TROPONIN I (HIGH SENSITIVITY): Troponin I (High Sensitivity): 3 ng/L (ref <18)

## 2023-08-17 LAB — LIPASE, BLOOD: Lipase: 36 U/L (ref 11–51)

## 2023-08-17 LAB — POC URINE PREG, ED: Preg Test, Ur: NEGATIVE

## 2023-08-17 MED ORDER — IOHEXOL 300 MG/ML  SOLN
100.0000 mL | Freq: Once | INTRAMUSCULAR | Status: AC | PRN
  Administered 2023-08-17: 100 mL via INTRAVENOUS

## 2023-08-17 MED ORDER — ONDANSETRON 4 MG PO TBDP: 4.0000 mg | ORAL_TABLET | Freq: Three times a day (TID) | ORAL | 0 refills | Status: AC | PRN

## 2023-08-17 MED ORDER — DROPERIDOL 2.5 MG/ML IJ SOLN
2.5000 mg | Freq: Once | INTRAMUSCULAR | Status: AC
  Administered 2023-08-17: 2.5 mg via INTRAVENOUS
  Filled 2023-08-17: qty 2

## 2023-08-17 MED ORDER — FAMOTIDINE IN NACL 20-0.9 MG/50ML-% IV SOLN
20.0000 mg | Freq: Once | INTRAVENOUS | Status: AC
  Administered 2023-08-17: 20 mg via INTRAVENOUS
  Filled 2023-08-17: qty 50

## 2023-08-17 MED ORDER — ONDANSETRON HCL 4 MG/2ML IJ SOLN
4.0000 mg | Freq: Once | INTRAMUSCULAR | Status: AC
  Administered 2023-08-17: 4 mg via INTRAVENOUS
  Filled 2023-08-17: qty 2

## 2023-08-17 MED ORDER — FAMOTIDINE 20 MG PO TABS: 20.0000 mg | ORAL_TABLET | Freq: Every day | ORAL | 0 refills | Status: AC

## 2023-08-17 NOTE — ED Provider Notes (Signed)
[facility] Provider Note    Event Date/Time   First MD Initiated Contact with Patient 08/17/23 1032     (approximate)   History   Emesis and Abdominal Pain   HPI  Erica Bush is a 42 y.o. female past medical history significant for diabetes, presents to the emergency department with abdominal pain with nausea and vomiting.  States that she recently had her wimp some teeth removed.  Since that time has been having multiple episodes of nausea and vomiting and upper abdominal pain.  States that she had a similar episode in the past ended up hospitalized for multiple weeks.  History of cholecystectomy and appendectomy.  Partial bowel resection.  Passing flatus.  Denies any fever.  No urinary symptoms.     Physical Exam   Triage Vital Signs: ED Triage Vitals  Encounter Vitals Group     BP 08/17/23 0920 106/88     Systolic BP Percentile --      Diastolic BP Percentile --      Pulse Rate 08/17/23 0920 94     Resp 08/17/23 0920 18     Temp 08/17/23 0920 97.8 F (36.6 C)     Temp Source 08/17/23 0920 Oral     SpO2 08/17/23 0920 97 %     Weight 08/17/23 0921 148 lb (67.1 kg)     Height 08/17/23 0921 5\' 2"  (1.575 m)     Head Circumference --      Peak Flow --      Pain Score 08/17/23 0921 6     Pain Loc --      Pain Education --      Exclude from Growth Chart --     Most recent vital signs: Vitals:   08/17/23 0920 08/17/23 1426  BP: 106/88 97/63  Pulse: 94 87  Resp: 18 18  Temp: 97.8 F (36.6 C) 98 F (36.7 C)  SpO2: 97% 100%    Physical Exam Constitutional:      Appearance: She is well-developed.  HENT:     Head: Atraumatic.  Eyes:     Conjunctiva/sclera: Conjunctivae normal.  Cardiovascular:     Rate and Rhythm: Regular rhythm.  Pulmonary:     Effort: No respiratory distress.  Abdominal:     General: There is no distension.     Tenderness: There is abdominal tenderness in the epigastric area.  Musculoskeletal:         General: Normal range of motion.     Cervical back: Normal range of motion.  Skin:    General: Skin is warm.     Capillary Refill: Capillary refill takes less than 2 seconds.  Neurological:     Mental Status: She is alert. Mental status is at baseline.     IMPRESSION / MDM / ASSESSMENT AND PLAN / ED COURSE  I reviewed the triage vital signs and the nursing notes.  Differential diagnosis including dehydration, medication side effect, gastroparesis, hyperglycemia, bowel obstruction, gastritis/PUD  EKG  I, Corena Herter, the attending physician, personally viewed and interpreted this ECG.   Rate: Normal  Rhythm: Normal sinus  Axis: Normal  Intervals: Normal  ST&T Change: None  No tachycardic or bradycardic dysrhythmias while on cardiac telemetry.  RADIOLOGY I independently reviewed imaging, my interpretation of imaging: CT abdomen and pelvis without findings of free air or small bowel obstruction  Read as no acute findings  LABS (all labs ordered are listed, but only abnormal results are displayed) Labs interpreted  as -    Labs Reviewed  COMPREHENSIVE METABOLIC PANEL - Abnormal; Notable for the following components:      Result Value   Glucose, Bld 247 (*)    Total Bilirubin 1.5 (*)    All other components within normal limits  CBC - Abnormal; Notable for the following components:   Hemoglobin 16.0 (*)    All other components within normal limits  URINALYSIS, ROUTINE W REFLEX MICROSCOPIC - Abnormal; Notable for the following components:   Color, Urine YELLOW (*)    APPearance CLEAR (*)    Specific Gravity, Urine 1.035 (*)    Glucose, UA >=500 (*)    Ketones, ur 5 (*)    All other components within normal limits  LIPASE, BLOOD  POC URINE PREG, ED  TROPONIN I (HIGH SENSITIVITY)     MDM    Patient was given IV antiemetics, Pepcid  Lab work overall reassuring, hyperglycemia but does not meet criteria for DKA.  Elevated hemoglobin that is likely in the setting  of dehydration.  CT scan without acute findings.  On reevaluation tolerating p.o.  States that her pain and symptoms have significantly improved.  Possibly secondary to gastroparesis versus side effect from pain medications from recent wisdom teeth removal.  Given a prescription for Pepcid and Zofran.  Discussed close follow-up with primary care physician.  Given return precautions for any worsening symptoms.   PROCEDURES:  Critical Care performed: No  Procedures  Patient's presentation is most consistent with acute presentation with potential threat to life or bodily function.   MEDICATIONS ORDERED IN ED: Medications  droperidol (INAPSINE) 2.5 MG/ML injection 2.5 mg (2.5 mg Intravenous Given 08/17/23 1202)  iohexol (OMNIPAQUE) 300 MG/ML solution 100 mL (100 mLs Intravenous Contrast Given 08/17/23 1347)  famotidine (PEPCID) IVPB 20 mg premix (0 mg Intravenous Stopped 08/17/23 1509)  ondansetron (ZOFRAN) injection 4 mg (4 mg Intravenous Given 08/17/23 1438)    FINAL CLINICAL IMPRESSION(S) / ED DIAGNOSES   Final diagnoses:  Gastroenteritis  Nausea vomiting and diarrhea     Rx / DC Orders   ED Discharge Orders          Ordered    ondansetron (ZOFRAN-ODT) 4 MG disintegrating tablet  Every 8 hours PRN        08/17/23 1426    famotidine (PEPCID) 20 MG tablet  Daily        08/17/23 1426             Note:  This document was prepared using Dragon voice recognition software and may include unintentional dictation errors.   Corena Herter, MD 08/17/23 [phone]

## 2023-08-17 NOTE — ED Triage Notes (Addendum)
Pt presents to ED with c/o of vomiting for 4 days after having oral surgery-wisdom teeth removal. Pt states she was put under for procedure. Pt also endorses ABD pain, HX of diverticulitis. NAD noted.

## 2024-02-04 ENCOUNTER — Other Ambulatory Visit: Payer: Self-pay

## 2024-02-04 ENCOUNTER — Emergency Department
Admission: EM | Admit: 2024-02-04 | Discharge: 2024-02-04 | Disposition: A | Discharge status: Home / Self Care | Attending: Emergency Medicine | Admitting: Emergency Medicine

## 2024-02-04 DIAGNOSIS — L089 Local infection of the skin and subcutaneous tissue, unspecified: Secondary | ICD-10-CM

## 2024-02-04 DIAGNOSIS — Z8542 Personal history of malignant neoplasm of other parts of uterus: Secondary | ICD-10-CM | POA: Insufficient documentation

## 2024-02-04 DIAGNOSIS — E1140 Type 2 diabetes mellitus with diabetic neuropathy, unspecified: Secondary | ICD-10-CM | POA: Diagnosis not present

## 2024-02-04 DIAGNOSIS — L03032 Cellulitis of left toe: Secondary | ICD-10-CM | POA: Insufficient documentation

## 2024-02-04 DIAGNOSIS — M79675 Pain in left toe(s): Secondary | ICD-10-CM | POA: Diagnosis present

## 2024-02-04 MED ORDER — DOXYCYCLINE HYCLATE 100 MG PO CAPS: 100.0000 mg | ORAL_CAPSULE | Freq: Two times a day (BID) | ORAL | 0 refills | Status: AC

## 2024-02-04 NOTE — ED Notes (Signed)
 See triage note. Presents with redness to left middle toe Denies any injury  No drainage

## 2024-02-04 NOTE — Discharge Instructions (Signed)
 Please take the antibiotics as prescribed.  Schedule follow-up appointment with your primary care provider for a wound check later this week.  Return to the emergency department with any worsening symptoms.

## 2024-02-04 NOTE — ED Triage Notes (Signed)
 Pt sts that she is a diabetic and notice yesterday that her second toe on the left foot is red.

## 2024-02-04 NOTE — ED Provider Notes (Signed)
 [facility] Provider Note    Event Date/Time   First MD Initiated Contact with Patient 02/04/24 [phone]     (approximate)   History   Toe Pain   HPI  Erica Bush is a 43 y.o. female with PMH of diabetes and endometrial cancer who presents for evaluation of toe pain.  Patient states that yesterday she noticed her second toe on her left foot was red.  She did try clipping her toenails and noticed a small amount of bleeding after this.  Patient endorses neuropathy and states that she does not feel any pain to her toe.  She states she is very concerned as her father had diabetes and had to have multiple toes amputated.      Physical Exam   Triage Vital Signs: ED Triage Vitals [02/04/24 0814]  Encounter Vitals Group     BP (!) 108/58     Systolic BP Percentile      Diastolic BP Percentile      Pulse Rate 86     Resp 18     Temp 98.1 F (36.7 C)     Temp Source Oral     SpO2 99 %     Weight 150 lb (68 kg)     Height 5\' 2"  (1.575 m)     Head Circumference      Peak Flow      Pain Score 0     Pain Loc      Pain Education      Exclude from Growth Chart     Most recent vital signs: Vitals:   02/04/24 0814  BP: (!) 108/58  Pulse: 86  Resp: 18  Temp: 98.1 F (36.7 C)  SpO2: 99%   General: Awake, no distress.  CV:  Good peripheral perfusion.  Resp:  Normal effort.  Abd:  No distention.  Other:  Area surrounding the left second toenail is erythematous and warm, not significantly swollen when compared to the other toes, no tenderness to palpation, compression of the toenail causes small amount of serosanguineous drainage.  See media portion of patient's chart.   ED Results / Procedures / Treatments   Labs (all labs ordered are listed, but only abnormal results are displayed) Labs Reviewed - No data to display   PROCEDURES:  Critical Care performed: No  Procedures   MEDICATIONS ORDERED IN ED: Medications - No data to  display   IMPRESSION / MDM / ASSESSMENT AND PLAN / ED COURSE  I reviewed the triage vital signs and the nursing notes.                             43 year old presents for evaluation of left toe redness.  Vital signs are stable and patient is anxious on exam.  Differential diagnosis includes, but is not limited to, cellulitis, ingrown toenail, paronychia.  Patient's presentation is most consistent with acute, uncomplicated illness.  Patient's toe does appear to be infected so we will start her on oral antibiotics.  Do not feel that IV antibiotics are indicated at this point.  Recommended that she follow-up with her primary care provider later this week.  Patient voiced understanding, all questions were answered and she was stable at discharge.      FINAL CLINICAL IMPRESSION(S) / ED DIAGNOSES   Final diagnoses:  Toe infection     Rx / DC Orders   ED Discharge Orders  Ordered    doxycycline (VIBRAMYCIN) 100 MG capsule  2 times daily        02/04/24 0836             Note:  This document was prepared using Dragon voice recognition software and may include unintentional dictation errors.   Phyliss Breen, PA-C 02/04/24 1610    Viviano Ground, MD 02/04/24 [phone]

## 2024-02-29 ENCOUNTER — Other Ambulatory Visit: Payer: Self-pay | Admitting: Family Medicine

## 2024-02-29 DIAGNOSIS — Z1231 Encounter for screening mammogram for malignant neoplasm of breast: Secondary | ICD-10-CM

## 2024-03-13 ENCOUNTER — Ambulatory Visit
Admission: RE | Admit: 2024-03-13 | Discharge: 2024-03-13 | Disposition: A | Discharge status: Home / Self Care | Source: Ambulatory Visit | Attending: Family Medicine | Admitting: Family Medicine

## 2024-03-13 DIAGNOSIS — Z1231 Encounter for screening mammogram for malignant neoplasm of breast: Secondary | ICD-10-CM | POA: Diagnosis present

## 2024-03-27 ENCOUNTER — Other Ambulatory Visit: Payer: Self-pay | Admitting: Licensed Clinical Social Worker

## 2024-03-27 ENCOUNTER — Inpatient Hospital Stay

## 2024-03-27 ENCOUNTER — Inpatient Hospital Stay: Attending: Oncology | Admitting: Licensed Clinical Social Worker

## 2024-03-27 ENCOUNTER — Encounter: Payer: Self-pay | Admitting: Licensed Clinical Social Worker

## 2024-03-27 DIAGNOSIS — Z8049 Family history of malignant neoplasm of other genital organs: Secondary | ICD-10-CM | POA: Diagnosis not present

## 2024-03-27 DIAGNOSIS — Z8542 Personal history of malignant neoplasm of other parts of uterus: Secondary | ICD-10-CM

## 2024-03-27 LAB — GENETIC SCREENING ORDER

## 2024-03-27 NOTE — Progress Notes (Signed)
 REFERRING PROVIDER: Schermerhorn, Joselyn Nicely, MD [address] [facility] [place],  [place] [address]  PRIMARY PROVIDER:  Bruce Caper Hestle, PA-C  PRIMARY REASON FOR VISIT:  1. History of endometrial cancer   2. Family history of cervical cancer      HISTORY OF PRESENT ILLNESS:   Erica Bush, a 43 y.o. female, was seen for a  cancer genetics consultation at the request of Dr. Madelene Schanz due to her personal history of endometrial cancer.  Erica Bush presents to clinic today to discuss the possibility of a hereditary predisposition to cancer, genetic testing, and to further clarify her future cancer risks, as well as potential cancer risks for family members.   CANCER HISTORY:  In 2005, at the age of 76, Erica Bush was diagnosed with endometrial cancer. This was treated with TAH and radiation. She reports they left one ovary but it has not been located on subsequent scans.  RISK FACTORS:  Menarche was at age 22.  Ovaries intact: 1.  Hysterectomy: yes.  Colonoscopy: yes; 2016, has one scheduled for 05/2024. Mammogram within the last year: yes. Number of breast biopsies: 0. Up to date with pelvic exams: yes.  Past Medical History:  Diagnosis Date   Cancer (HCC)    Diabetes mellitus without complication (HCC)    Diverticulitis    Endometrial cancer (HCC)    age 84    Past Surgical History:  Procedure Laterality Date   ABDOMINAL HYSTERECTOMY      FAMILY HISTORY:  We obtained a detailed, 4-generation family history.  Significant diagnoses are listed below: Family History  Problem Relation Age of Onset   Cervical cancer Mother 26   Hypertension Mother    Diabetes Mother    Alzheimer's disease Mother    Hypertension Father    Heart disease Father    Diabetes Father    Alzheimer's disease Maternal Aunt    Alzheimer's disease Maternal Grandmother    Diabetes Paternal Grandmother    Hypertension Paternal Grandmother     Hyperlipidemia Paternal Grandmother    Heart disease Paternal Grandmother    Cervical cancer Paternal Great-grandmother    Erica Bush has 1 sister, 10, and 1 nephew.   Erica Bush mother had cervical cancer at 50 and recently was diagnosed with Alzheimer's in her late 60s-early 12s. [place]vs in [place] Erica Bush is interested in genetic testing for Alzheimer's but is unsure if her mother has had genetic testing for it. Erica Bush maternal aunt and grandmother also had Alzheimer's and have passed. No other known cancers on this side of the family.  No known cancers on paternal side of the family aside from paternal great grandmother who had cervical cancer.   Erica Bush is unaware of previous family history of genetic testing for hereditary cancer risks. There is no reported Ashkenazi Jewish ancestry. There is no known consanguinity.    GENETIC COUNSELING ASSESSMENT: Erica Bush is a 43 y.o. female with a personal history of endometrial cancer at 45 which is somewhat suggestive of a hereditary cancer syndrome and predisposition to cancer. We, therefore, discussed and recommended the following at today's visit.   DISCUSSION: We discussed that approximately 5-10% of endometrial cancer is hereditary. Most cases of hereditary endometrial cancer are associated with Lynch syndrome genes, although there are other genes associated with hereditary cancer as well. Cancers and risks are gene specific. We discussed that testing is beneficial for several reasons including knowing about cancer risks, identifying potential screening and risk-reduction  options that may be appropriate, and to understand if other family members could be at risk for cancer and allow them to undergo genetic testing.   We reviewed the characteristics, features and inheritance patterns of hereditary cancer syndromes. We also discussed genetic testing, including the appropriate family members to test, the process of  testing, insurance coverage and turn-around-time for results. We discussed the implications of a negative, positive and/or variant of uncertain significant result. We recommended Erica Bush pursue genetic testing for the [facility] CancerNext+RNA gene panel.   The [facility] CancerNext+RNAinsight Panel includes sequencing, rearrangement analysis, and RNA analysis for the following 40 genes: APC, ATM, BAP1, BARD1, BMPR1A, BRCA1, BRCA2, BRIP1, CDH1, CDKN2A, CHEK2, FH, FLCN, MET, MLH1, MSH2, MSH6, MUTYH, NF1, NTHL1, PALB2, PMS2, PTEN, RAD51C, RAD51D, RPS20, SMAD4, STK11, TP53, TSC1, TSC2, and VHL (sequencing and deletion/duplication); AXIN2, HOXB13, MBD4, MSH3, POLD1 and POLE (sequencing only); EPCAM and GREM1 (deletion/duplication only).  Based on Erica Bush's personal history of cancer, she meets medical criteria for genetic testing. Despite that she meets criteria, she may still have an out of pocket cost.   PLAN: After considering the risks, benefits, and limitations, Erica Bush provided informed consent to pursue genetic testing and the blood sample was sent to [facility] for analysis of the CancerNext+RNA panel. Results should be available within approximately 2-3 weeks' time, at which point they will be disclosed by telephone to Erica Bush, as will any additional recommendations warranted by these results. Erica Bush will receive a summary of her genetic counseling visit and a copy of her results once available. This information will also be available in Epic.   Erica Bush questions were answered to her satisfaction today. Our contact information was provided should additional questions or concerns arise. Thank you for the referral and allowing us  to share in the care of your patient.   Erica Gee, MS, [facility] Genetic Counselor [email or web address] Phone: [phone]  55 minutes were spent on the date of the encounter in service to the patient including preparation,  face-to-face consultation, documentation and care coordination. Dr. Nelson Bandy was available for discussion regarding this case.   _______________________________________________________________________ For Office Staff:  Number of people involved in session: 2 Was an Intern/ student involved with case: yes; pharmacy student Constancia Delton was also present for observation.

## 2024-04-09 ENCOUNTER — Encounter: Payer: Self-pay | Admitting: Licensed Clinical Social Worker

## 2024-04-09 ENCOUNTER — Ambulatory Visit: Payer: Self-pay | Admitting: Licensed Clinical Social Worker

## 2024-04-09 ENCOUNTER — Telehealth: Payer: Self-pay | Admitting: Licensed Clinical Social Worker

## 2024-04-09 DIAGNOSIS — Z1379 Encounter for other screening for genetic and chromosomal anomalies: Secondary | ICD-10-CM

## 2024-04-09 NOTE — Telephone Encounter (Signed)
 I contacted Erica Bush to discuss her genetic testing results. No pathogenic variants were identified in the 40 genes analyzed. Detailed clinic note to follow.   The test report has been scanned into EPIC and is located under the Molecular Pathology section of the Results Review tab.  A portion of the result report is included below for reference.      [name], MS, [facility] Genetic Counselor [name] Phone: [phone]

## 2024-04-09 NOTE — Progress Notes (Signed)
 HPI:   Erica Bush was previously seen in the  Cancer Genetics clinic due to a personal and family history of cancer and concerns regarding a hereditary predisposition to cancer. Please refer to our prior cancer genetics clinic note for more information regarding our discussion, assessment and recommendations, at the time. Erica Bush recent genetic test results were disclosed to her, as were recommendations warranted by these results. These results and recommendations are discussed in more detail below.  CANCER HISTORY:  Oncology History   No history exists.    FAMILY HISTORY:  We obtained a detailed, 4-generation family history.  Significant diagnoses are listed below: Family History  Problem Relation Age of Onset   Cervical cancer Mother 60   Hypertension Mother    Diabetes Mother    Alzheimer's disease Mother    Hypertension Father    Heart disease Father    Diabetes Father    Alzheimer's disease Maternal Aunt    Alzheimer's disease Maternal Grandmother    Diabetes Paternal Grandmother    Hypertension Paternal Grandmother    Hyperlipidemia Paternal Grandmother    Heart disease Paternal Grandmother    Cervical cancer Paternal Great-grandmother     Erica Bush has 1 sister, 41, and 1 nephew.    Erica Bush mother had cervical cancer at 26 and recently was diagnosed with Alzheimer's in her late 27s-early 41s. [place] in [place] Erica Bush is interested in genetic testing for Alzheimer's but is unsure if her mother has had genetic testing for it. Erica Bush maternal aunt and grandmother also had Alzheimer's and have passed. No other known cancers on this side of the family.   No known cancers on paternal side of the family aside from paternal great grandmother who had cervical cancer.    Erica Bush is unaware of previous family history of genetic testing for hereditary cancer risks. There is no reported Ashkenazi Jewish ancestry. There is no known  consanguinity.     GENETIC TEST RESULTS:  The [facility] CancerNext+RNA Panel found no pathogenic mutations.   The [facility] CancerNext+RNAinsight Panel includes sequencing, rearrangement analysis, and RNA analysis for the following 40 genes: APC, ATM, BAP1, BARD1, BMPR1A, BRCA1, BRCA2, BRIP1, CDH1, CDKN2A, CHEK2, FH, FLCN, MET, MLH1, MSH2, MSH6, MUTYH, NF1, NTHL1, PALB2, PMS2, PTEN, RAD51C, RAD51D, RPS20, SMAD4, STK11, TP53, TSC1, TSC2, and VHL (sequencing and deletion/duplication); AXIN2, HOXB13, MBD4, MSH3, POLD1 and POLE (sequencing only); EPCAM and GREM1 (deletion/duplication only).   The test report has been scanned into EPIC and is located under the Molecular Pathology section of the Results Review tab.  A portion of the result report is included below for reference. Genetic testing reported out on 04/05/2024.      Even though a pathogenic variant was not identified, possible explanations for the cancer in the family may include: There may be no hereditary risk for cancer in the family. The cancers in Erica Bush and/or her family may be sporadic/familial or due to other genetic and environmental factors. There may be a gene mutation in one of these genes that current testing methods cannot detect but that chance is small. There could be another gene that has not yet been discovered, or that we have not yet tested, that is responsible for the cancer diagnoses in the family.  It is also possible there is a hereditary cause for the cancer in the family that Erica Bush did not inherit Therefore, it is important to remain in touch with cancer genetics in the future so  that we can continue to offer Erica Bush the most up to date genetic testing.   ADDITIONAL GENETIC TESTING:  We discussed with Erica Bush that her genetic testing was fairly extensive.  If there are additional relevant genes identified to increase cancer risk that can be analyzed in the future, we would be happy to discuss and  coordinate this testing at that time.    CANCER SCREENING RECOMMENDATIONS:  Erica Bush test result is considered negative (normal).  This means that we have not identified a hereditary cause for her personal and family history of cancer at this time.   An individual's cancer risk and medical management are not determined by genetic test results alone. Overall cancer risk assessment incorporates additional factors, including personal medical history, family history, and any available genetic information that may result in a personalized plan for cancer prevention and surveillance. Therefore, it is recommended she continue to follow the cancer management and screening guidelines provided by her primary healthcare provider.  RECOMMENDATIONS FOR FAMILY MEMBERS:   Since she did not inherit a identifiable mutation in a cancer predisposition gene included on this panel, her children could not have inherited a known mutation from her in one of these genes. Individuals in this family might be at some increased risk of developing cancer, over the general population risk, due to the family history of cancer.  Individuals in the family should notify their providers of the family history of cancer. We recommend women in this family have a yearly mammogram beginning at age 48, or 30 years younger than the earliest onset of cancer, an annual clinical breast exam, and perform monthly breast self-exams.  Family members should have colonoscopies by at age 66, or earlier, as recommended by their providers.  FOLLOW-UP:  Lastly, we discussed with Erica Bush that cancer genetics is a rapidly advancing field and it is possible that new genetic tests will be appropriate for her and/or her family members in the future. We encouraged her to remain in contact with cancer genetics on an annual basis so we can update her personal and family histories and let her know of advances in cancer genetics that may benefit this family.    Our contact number was provided. Erica Bush questions were answered to her satisfaction, and she knows she is welcome to call us  at anytime with additional questions or concerns.    [name], MS, [facility] Genetic Counselor [email or web address] Phone: [phone]

## 2024-06-26 ENCOUNTER — Other Ambulatory Visit (INDEPENDENT_AMBULATORY_CARE_PROVIDER_SITE_OTHER): Payer: Self-pay | Admitting: Vascular Surgery

## 2024-06-26 DIAGNOSIS — I83813 Varicose veins of bilateral lower extremities with pain: Secondary | ICD-10-CM

## 2024-06-27 ENCOUNTER — Encounter (INDEPENDENT_AMBULATORY_CARE_PROVIDER_SITE_OTHER): Payer: Self-pay | Admitting: Nurse Practitioner

## 2024-06-27 ENCOUNTER — Encounter (INDEPENDENT_AMBULATORY_CARE_PROVIDER_SITE_OTHER): Payer: Self-pay

## 2024-12-10 ENCOUNTER — Ambulatory Visit: Admit: 2024-12-10 | Admitting: Internal Medicine
